# Patient Record
Sex: Female | Born: 1945
Health system: Southern US, Community
[De-identification: ages and names within clinical notes are randomized; demographics above are authoritative.]

## PROBLEM LIST (undated history)

## (undated) DIAGNOSIS — F419 Anxiety disorder, unspecified: Secondary | ICD-10-CM

## (undated) DIAGNOSIS — N189 Chronic kidney disease, unspecified: Secondary | ICD-10-CM

## (undated) DIAGNOSIS — M5412 Radiculopathy, cervical region: Secondary | ICD-10-CM

## (undated) DIAGNOSIS — M1990 Unspecified osteoarthritis, unspecified site: Secondary | ICD-10-CM

## (undated) DIAGNOSIS — E785 Hyperlipidemia, unspecified: Secondary | ICD-10-CM

## (undated) DIAGNOSIS — Z923 Personal history of irradiation: Secondary | ICD-10-CM

## (undated) DIAGNOSIS — D649 Anemia, unspecified: Secondary | ICD-10-CM

## (undated) DIAGNOSIS — E669 Obesity, unspecified: Secondary | ICD-10-CM

## (undated) DIAGNOSIS — H269 Unspecified cataract: Secondary | ICD-10-CM

## (undated) DIAGNOSIS — C50919 Malignant neoplasm of unspecified site of unspecified female breast: Secondary | ICD-10-CM

## (undated) DIAGNOSIS — H9190 Unspecified hearing loss, unspecified ear: Secondary | ICD-10-CM

## (undated) HISTORY — DX: Obesity, unspecified: E66.9

## (undated) HISTORY — DX: Anemia, unspecified: D64.9

## (undated) HISTORY — DX: Unspecified hearing loss, unspecified ear: H91.90

## (undated) HISTORY — PX: FOOT SURGERY: SHX648

## (undated) HISTORY — DX: Chronic kidney disease, unspecified: N18.9

## (undated) HISTORY — PX: BREAST LUMPECTOMY: SHX2

## (undated) HISTORY — DX: Unspecified cataract: H26.9

## (undated) HISTORY — DX: Malignant neoplasm of unspecified site of unspecified female breast: C50.919

## (undated) HISTORY — DX: Radiculopathy, cervical region: M54.12

## (undated) HISTORY — DX: Hyperlipidemia, unspecified: E78.5

## (undated) HISTORY — DX: Unspecified osteoarthritis, unspecified site: M19.90

## (undated) HISTORY — DX: Anxiety disorder, unspecified: F41.9

## (undated) HISTORY — PX: BREAST BIOPSY: SHX20

---

## 1998-01-14 ENCOUNTER — Other Ambulatory Visit: Admission: RE | Admit: 1998-01-14 | Discharge: 1998-01-14 | Payer: Self-pay | Admitting: *Deleted

## 1999-08-11 ENCOUNTER — Other Ambulatory Visit: Admission: RE | Admit: 1999-08-11 | Discharge: 1999-08-11 | Payer: Self-pay | Admitting: *Deleted

## 1999-09-17 ENCOUNTER — Encounter: Admission: RE | Admit: 1999-09-17 | Discharge: 1999-09-17 | Payer: Self-pay | Admitting: *Deleted

## 1999-09-17 ENCOUNTER — Encounter: Payer: Self-pay | Admitting: *Deleted

## 2000-07-28 ENCOUNTER — Other Ambulatory Visit: Admission: RE | Admit: 2000-07-28 | Discharge: 2000-07-28 | Payer: Self-pay | Admitting: *Deleted

## 2000-08-03 ENCOUNTER — Encounter: Admission: RE | Admit: 2000-08-03 | Discharge: 2000-08-03 | Payer: Self-pay | Admitting: *Deleted

## 2000-08-03 ENCOUNTER — Encounter: Payer: Self-pay | Admitting: *Deleted

## 2000-09-02 ENCOUNTER — Other Ambulatory Visit: Admission: RE | Admit: 2000-09-02 | Discharge: 2000-09-02 | Payer: Self-pay | Admitting: Obstetrics and Gynecology

## 2001-09-20 ENCOUNTER — Other Ambulatory Visit: Admission: RE | Admit: 2001-09-20 | Discharge: 2001-09-20 | Payer: Self-pay | Admitting: *Deleted

## 2002-11-24 ENCOUNTER — Encounter: Admission: RE | Admit: 2002-11-24 | Discharge: 2002-11-24 | Payer: Self-pay

## 2003-10-30 ENCOUNTER — Other Ambulatory Visit: Admission: RE | Admit: 2003-10-30 | Discharge: 2003-10-30 | Payer: Self-pay | Admitting: Family Medicine

## 2004-10-15 ENCOUNTER — Emergency Department (HOSPITAL_COMMUNITY): Admission: EM | Admit: 2004-10-15 | Discharge: 2004-10-15 | Payer: Self-pay | Admitting: Emergency Medicine

## 2005-03-19 ENCOUNTER — Other Ambulatory Visit: Admission: RE | Admit: 2005-03-19 | Discharge: 2005-03-19 | Payer: Self-pay | Admitting: Family Medicine

## 2005-05-19 ENCOUNTER — Ambulatory Visit (HOSPITAL_COMMUNITY): Admission: RE | Admit: 2005-05-19 | Discharge: 2005-05-19 | Payer: Self-pay | Admitting: Family Medicine

## 2006-06-08 HISTORY — PX: MENISCUS REPAIR: SHX5179

## 2011-07-20 DIAGNOSIS — H9319 Tinnitus, unspecified ear: Secondary | ICD-10-CM | POA: Diagnosis not present

## 2011-07-20 DIAGNOSIS — J342 Deviated nasal septum: Secondary | ICD-10-CM | POA: Diagnosis not present

## 2011-07-20 DIAGNOSIS — J3089 Other allergic rhinitis: Secondary | ICD-10-CM | POA: Diagnosis not present

## 2011-08-17 DIAGNOSIS — J019 Acute sinusitis, unspecified: Secondary | ICD-10-CM | POA: Diagnosis not present

## 2011-10-01 DIAGNOSIS — H04129 Dry eye syndrome of unspecified lacrimal gland: Secondary | ICD-10-CM | POA: Diagnosis not present

## 2011-10-01 DIAGNOSIS — E119 Type 2 diabetes mellitus without complications: Secondary | ICD-10-CM | POA: Diagnosis not present

## 2011-10-01 DIAGNOSIS — H01009 Unspecified blepharitis unspecified eye, unspecified eyelid: Secondary | ICD-10-CM | POA: Diagnosis not present

## 2011-12-23 DIAGNOSIS — R7309 Other abnormal glucose: Secondary | ICD-10-CM | POA: Diagnosis not present

## 2011-12-23 DIAGNOSIS — E7800 Pure hypercholesterolemia, unspecified: Secondary | ICD-10-CM | POA: Diagnosis not present

## 2011-12-23 DIAGNOSIS — Z0000 Encounter for general adult medical examination without abnormal findings: Secondary | ICD-10-CM | POA: Diagnosis not present

## 2011-12-24 DIAGNOSIS — E7800 Pure hypercholesterolemia, unspecified: Secondary | ICD-10-CM | POA: Diagnosis not present

## 2011-12-24 DIAGNOSIS — R7309 Other abnormal glucose: Secondary | ICD-10-CM | POA: Diagnosis not present

## 2011-12-28 DIAGNOSIS — Z1231 Encounter for screening mammogram for malignant neoplasm of breast: Secondary | ICD-10-CM | POA: Diagnosis not present

## 2012-03-01 DIAGNOSIS — Z23 Encounter for immunization: Secondary | ICD-10-CM | POA: Diagnosis not present

## 2012-05-16 DIAGNOSIS — R109 Unspecified abdominal pain: Secondary | ICD-10-CM | POA: Diagnosis not present

## 2012-05-16 DIAGNOSIS — R05 Cough: Secondary | ICD-10-CM | POA: Diagnosis not present

## 2012-05-16 DIAGNOSIS — R059 Cough, unspecified: Secondary | ICD-10-CM | POA: Diagnosis not present

## 2012-06-12 DIAGNOSIS — J069 Acute upper respiratory infection, unspecified: Secondary | ICD-10-CM | POA: Diagnosis not present

## 2012-06-12 DIAGNOSIS — R0982 Postnasal drip: Secondary | ICD-10-CM | POA: Diagnosis not present

## 2012-06-12 DIAGNOSIS — R05 Cough: Secondary | ICD-10-CM | POA: Diagnosis not present

## 2012-06-12 DIAGNOSIS — J3489 Other specified disorders of nose and nasal sinuses: Secondary | ICD-10-CM | POA: Diagnosis not present

## 2012-06-12 DIAGNOSIS — R059 Cough, unspecified: Secondary | ICD-10-CM | POA: Diagnosis not present

## 2012-08-29 DIAGNOSIS — Z961 Presence of intraocular lens: Secondary | ICD-10-CM | POA: Diagnosis not present

## 2012-08-29 DIAGNOSIS — E119 Type 2 diabetes mellitus without complications: Secondary | ICD-10-CM | POA: Diagnosis not present

## 2012-08-29 DIAGNOSIS — H01009 Unspecified blepharitis unspecified eye, unspecified eyelid: Secondary | ICD-10-CM | POA: Diagnosis not present

## 2012-08-29 DIAGNOSIS — H04129 Dry eye syndrome of unspecified lacrimal gland: Secondary | ICD-10-CM | POA: Diagnosis not present

## 2012-08-30 DIAGNOSIS — R7309 Other abnormal glucose: Secondary | ICD-10-CM | POA: Diagnosis not present

## 2013-03-18 DIAGNOSIS — Z23 Encounter for immunization: Secondary | ICD-10-CM | POA: Diagnosis not present

## 2013-04-17 DIAGNOSIS — R7309 Other abnormal glucose: Secondary | ICD-10-CM | POA: Diagnosis not present

## 2013-04-17 DIAGNOSIS — Z0000 Encounter for general adult medical examination without abnormal findings: Secondary | ICD-10-CM | POA: Diagnosis not present

## 2013-04-17 DIAGNOSIS — E7800 Pure hypercholesterolemia, unspecified: Secondary | ICD-10-CM | POA: Diagnosis not present

## 2013-04-17 DIAGNOSIS — R635 Abnormal weight gain: Secondary | ICD-10-CM | POA: Diagnosis not present

## 2013-04-24 DIAGNOSIS — Z1231 Encounter for screening mammogram for malignant neoplasm of breast: Secondary | ICD-10-CM | POA: Diagnosis not present

## 2013-04-24 DIAGNOSIS — R7309 Other abnormal glucose: Secondary | ICD-10-CM | POA: Diagnosis not present

## 2013-08-02 DIAGNOSIS — Z961 Presence of intraocular lens: Secondary | ICD-10-CM | POA: Diagnosis not present

## 2013-08-02 DIAGNOSIS — H04129 Dry eye syndrome of unspecified lacrimal gland: Secondary | ICD-10-CM | POA: Diagnosis not present

## 2013-08-02 DIAGNOSIS — H01009 Unspecified blepharitis unspecified eye, unspecified eyelid: Secondary | ICD-10-CM | POA: Diagnosis not present

## 2013-08-02 DIAGNOSIS — E119 Type 2 diabetes mellitus without complications: Secondary | ICD-10-CM | POA: Diagnosis not present

## 2013-09-14 DIAGNOSIS — L570 Actinic keratosis: Secondary | ICD-10-CM | POA: Diagnosis not present

## 2013-09-14 DIAGNOSIS — L821 Other seborrheic keratosis: Secondary | ICD-10-CM | POA: Diagnosis not present

## 2013-11-10 DIAGNOSIS — R82998 Other abnormal findings in urine: Secondary | ICD-10-CM | POA: Diagnosis not present

## 2013-11-10 DIAGNOSIS — E785 Hyperlipidemia, unspecified: Secondary | ICD-10-CM | POA: Diagnosis not present

## 2013-11-10 DIAGNOSIS — Z0000 Encounter for general adult medical examination without abnormal findings: Secondary | ICD-10-CM | POA: Diagnosis not present

## 2013-11-10 DIAGNOSIS — R809 Proteinuria, unspecified: Secondary | ICD-10-CM | POA: Diagnosis not present

## 2013-11-21 DIAGNOSIS — Z23 Encounter for immunization: Secondary | ICD-10-CM | POA: Diagnosis not present

## 2013-11-21 DIAGNOSIS — Z1331 Encounter for screening for depression: Secondary | ICD-10-CM | POA: Diagnosis not present

## 2013-11-21 DIAGNOSIS — E785 Hyperlipidemia, unspecified: Secondary | ICD-10-CM | POA: Diagnosis not present

## 2013-11-21 DIAGNOSIS — R7309 Other abnormal glucose: Secondary | ICD-10-CM | POA: Diagnosis not present

## 2013-11-21 DIAGNOSIS — R82998 Other abnormal findings in urine: Secondary | ICD-10-CM | POA: Diagnosis not present

## 2013-11-21 DIAGNOSIS — Z1212 Encounter for screening for malignant neoplasm of rectum: Secondary | ICD-10-CM | POA: Diagnosis not present

## 2013-11-21 DIAGNOSIS — Z0000 Encounter for general adult medical examination without abnormal findings: Secondary | ICD-10-CM | POA: Diagnosis not present

## 2013-11-21 DIAGNOSIS — F411 Generalized anxiety disorder: Secondary | ICD-10-CM | POA: Diagnosis not present

## 2013-11-21 DIAGNOSIS — N951 Menopausal and female climacteric states: Secondary | ICD-10-CM | POA: Diagnosis not present

## 2013-11-21 DIAGNOSIS — F419 Anxiety disorder, unspecified: Secondary | ICD-10-CM | POA: Insufficient documentation

## 2014-01-30 DIAGNOSIS — H00019 Hordeolum externum unspecified eye, unspecified eyelid: Secondary | ICD-10-CM | POA: Diagnosis not present

## 2014-01-30 DIAGNOSIS — M5430 Sciatica, unspecified side: Secondary | ICD-10-CM | POA: Diagnosis not present

## 2014-01-30 DIAGNOSIS — Z6830 Body mass index (BMI) 30.0-30.9, adult: Secondary | ICD-10-CM | POA: Diagnosis not present

## 2014-01-30 DIAGNOSIS — R7309 Other abnormal glucose: Secondary | ICD-10-CM | POA: Diagnosis not present

## 2014-01-30 DIAGNOSIS — M545 Low back pain, unspecified: Secondary | ICD-10-CM | POA: Diagnosis not present

## 2014-02-15 DIAGNOSIS — Z6830 Body mass index (BMI) 30.0-30.9, adult: Secondary | ICD-10-CM | POA: Diagnosis not present

## 2014-02-15 DIAGNOSIS — M545 Low back pain, unspecified: Secondary | ICD-10-CM | POA: Diagnosis not present

## 2014-06-08 HISTORY — PX: COLONOSCOPY: SHX174

## 2014-06-14 DIAGNOSIS — H9190 Unspecified hearing loss, unspecified ear: Secondary | ICD-10-CM | POA: Insufficient documentation

## 2014-06-22 DIAGNOSIS — H903 Sensorineural hearing loss, bilateral: Secondary | ICD-10-CM | POA: Diagnosis not present

## 2014-07-18 DIAGNOSIS — H16223 Keratoconjunctivitis sicca, not specified as Sjogren's, bilateral: Secondary | ICD-10-CM | POA: Diagnosis not present

## 2014-07-18 DIAGNOSIS — Z961 Presence of intraocular lens: Secondary | ICD-10-CM | POA: Diagnosis not present

## 2014-07-18 DIAGNOSIS — R7309 Other abnormal glucose: Secondary | ICD-10-CM | POA: Diagnosis not present

## 2014-07-18 DIAGNOSIS — H527 Unspecified disorder of refraction: Secondary | ICD-10-CM | POA: Diagnosis not present

## 2014-11-19 DIAGNOSIS — R739 Hyperglycemia, unspecified: Secondary | ICD-10-CM | POA: Diagnosis not present

## 2014-11-19 DIAGNOSIS — E785 Hyperlipidemia, unspecified: Secondary | ICD-10-CM | POA: Diagnosis not present

## 2014-11-19 DIAGNOSIS — R8290 Unspecified abnormal findings in urine: Secondary | ICD-10-CM | POA: Diagnosis not present

## 2014-11-26 ENCOUNTER — Other Ambulatory Visit: Payer: Self-pay | Admitting: Internal Medicine

## 2014-11-26 DIAGNOSIS — Z23 Encounter for immunization: Secondary | ICD-10-CM | POA: Diagnosis not present

## 2014-11-26 DIAGNOSIS — M5412 Radiculopathy, cervical region: Secondary | ICD-10-CM | POA: Diagnosis not present

## 2014-11-26 DIAGNOSIS — G4700 Insomnia, unspecified: Secondary | ICD-10-CM | POA: Diagnosis not present

## 2014-11-26 DIAGNOSIS — Z0000 Encounter for general adult medical examination without abnormal findings: Secondary | ICD-10-CM | POA: Diagnosis not present

## 2014-11-26 DIAGNOSIS — M19049 Primary osteoarthritis, unspecified hand: Secondary | ICD-10-CM | POA: Diagnosis not present

## 2014-11-26 DIAGNOSIS — Z6831 Body mass index (BMI) 31.0-31.9, adult: Secondary | ICD-10-CM | POA: Diagnosis not present

## 2014-11-26 DIAGNOSIS — F419 Anxiety disorder, unspecified: Secondary | ICD-10-CM | POA: Diagnosis not present

## 2014-11-26 DIAGNOSIS — N390 Urinary tract infection, site not specified: Secondary | ICD-10-CM | POA: Diagnosis not present

## 2014-11-26 DIAGNOSIS — E785 Hyperlipidemia, unspecified: Secondary | ICD-10-CM | POA: Diagnosis not present

## 2014-11-26 DIAGNOSIS — N183 Chronic kidney disease, stage 3 (moderate): Secondary | ICD-10-CM | POA: Diagnosis not present

## 2014-11-26 DIAGNOSIS — Z1212 Encounter for screening for malignant neoplasm of rectum: Secondary | ICD-10-CM | POA: Diagnosis not present

## 2014-11-26 DIAGNOSIS — Z1389 Encounter for screening for other disorder: Secondary | ICD-10-CM | POA: Diagnosis not present

## 2014-11-26 DIAGNOSIS — R7309 Other abnormal glucose: Secondary | ICD-10-CM | POA: Diagnosis not present

## 2014-11-26 DIAGNOSIS — Z1231 Encounter for screening mammogram for malignant neoplasm of breast: Secondary | ICD-10-CM

## 2014-11-29 ENCOUNTER — Ambulatory Visit
Admission: RE | Admit: 2014-11-29 | Discharge: 2014-11-29 | Disposition: A | Discharge status: Home / Self Care | Payer: Medicare Other | Source: Ambulatory Visit | Attending: Internal Medicine | Admitting: Internal Medicine

## 2014-11-29 DIAGNOSIS — Z1231 Encounter for screening mammogram for malignant neoplasm of breast: Secondary | ICD-10-CM | POA: Diagnosis not present

## 2014-12-07 ENCOUNTER — Encounter: Payer: Self-pay | Admitting: Gastroenterology

## 2014-12-17 DIAGNOSIS — Z01419 Encounter for gynecological examination (general) (routine) without abnormal findings: Secondary | ICD-10-CM | POA: Diagnosis not present

## 2014-12-17 DIAGNOSIS — N952 Postmenopausal atrophic vaginitis: Secondary | ICD-10-CM | POA: Diagnosis not present

## 2014-12-17 DIAGNOSIS — Z6831 Body mass index (BMI) 31.0-31.9, adult: Secondary | ICD-10-CM | POA: Diagnosis not present

## 2015-01-10 ENCOUNTER — Other Ambulatory Visit: Payer: Self-pay

## 2015-01-14 ENCOUNTER — Encounter: Payer: Self-pay | Admitting: Gastroenterology

## 2015-01-14 ENCOUNTER — Ambulatory Visit (INDEPENDENT_AMBULATORY_CARE_PROVIDER_SITE_OTHER): Payer: Medicare Other | Admitting: Gastroenterology

## 2015-01-14 VITALS — BP 122/80 | Ht 68.0 in | Wt 199.0 lb

## 2015-01-14 DIAGNOSIS — R195 Other fecal abnormalities: Secondary | ICD-10-CM | POA: Diagnosis not present

## 2015-01-14 MED ORDER — SOD PHOS MONO-SOD PHOS DIBASIC 1.102-0.398 G PO TABS: 1.0000 | ORAL_TABLET | ORAL | Status: DC

## 2015-01-14 NOTE — Progress Notes (Signed)
HPI: This is a  very pleasant 69 year old woman     who was referred to me by Velna Hatchet, MD  to evaluate  Hemoccult-positive stool .    Chief complaint is Hemoccult-positive stool  Had colonoscopy in [place] 5-6 years ago.   Don't have those results at time of this visit.  As part of routine physical she had FOBT testing and it was positive.  She has alternating constipation (2 days without BM, then loose stools).  This has been a chronic problem for her for many years. She has never tried fiber supplements.  She never sees blood in her stool She had heme occult testing last year also and says it was negative.     Review of systems: Pertinent positive and negative review of systems were noted in the above HPI section. Complete review of systems was performed and was otherwise normal.   Past Medical History  Diagnosis Date  . Anxiety   . HLD (hyperlipidemia)   . Obesity   . Hearing loss   . CKD (chronic kidney disease)     stage 3  . Cervical radiculopathy   . Osteoarthritis     Past Surgical History  Procedure Laterality Date  . Breast biopsy Left   . Meniscus repair Right 2008    Current Outpatient Prescriptions  Medication Sig Dispense Refill  . ALPRAZolam (XANAX) 1 MG tablet Take 1 tablet (1 mg total) by mouth 3 (three) times daily as needed for anxiety. 30 tablet 0  . simvastatin (ZOCOR) 40 MG tablet Take 1 tablet (40 mg total) by mouth at bedtime. 30 tablet   . trimethoprim-polymyxin b (POLYTRIM) ophthalmic solution Place 1 drop into the right eye every 4 (four) hours. 10 mL 0  . venlafaxine (EFFEXOR) 75 MG tablet Take 1 tablet (75 mg total) by mouth 2 (two) times daily with a meal.     No current facility-administered medications for this visit.    Allergies as of 01/14/2015  . (Not on File)    Family History  Problem Relation Age of Onset  . Heart disease Father     age 48 death  . Diabetes Mother   . Alcohol abuse Father   . Dementia Mother      History   Social History  . Marital Status: Married    Spouse Name: N/A  . Number of Children: N/A  . Years of Education: N/A   Occupational History  . Retired    Social History Main Topics  . Smoking status: Never Smoker   . Smokeless tobacco: Not on file  . Alcohol Use: 0.0 oz/week    0 Standard drinks or equivalent per week     Comment: social  . Drug Use: No  . Sexual Activity: Not on file   Other Topics Concern  . Not on file   Social History Narrative     Physical Exam: BP 122/80 mmHg  Ht 5\' 8"  (1.727 m)  Wt 199 lb (90.266 kg)  BMI 30.26 kg/m2 Constitutional: generally well-appearing Psychiatric: alert and oriented x3 Eyes: extraocular movements intact Mouth: oral pharynx moist, no lesions Neck: supple no lymphadenopathy Cardiovascular: heart regular rate and rhythm Lungs: clear to auscultation bilaterally Abdomen: soft, nontender, nondistended, no obvious ascites, no peritoneal signs, normal bowel sounds Extremities: no lower extremity edema bilaterally Skin: no lesions on visible extremities   Assessment and plan: 69 y.o. female with  Hemoccult-positive stool  As part of a routine physical she had stool testing done and  was found to be Hemoccult-positive. Since she had colonoscopy 5 or 6 years ago in [place] and it was normal, she was told she did not need colonoscopy again for 10 years. I think it is unlikely she has anything serious going on her: And since she was found to be Hemoccult-positive should proceed with repeat colonoscopy around now. I did explain to her that if this test shows no polyps or cancers that she does not need colon cancer screening of any type, including annual heme occult cards, for 10 years.   Owens Loffler, MD [facility] 01/14/2015, 10:08 AM  Cc: Velna Hatchet, MD

## 2015-01-14 NOTE — Patient Instructions (Addendum)
Start fibercon pill, one pill once daily for your alternating bowels. You will be set up for a colonoscopy for your hemocult positive stool.

## 2015-01-23 ENCOUNTER — Ambulatory Visit (AMBULATORY_SURGERY_CENTER): Payer: Medicare Other | Admitting: Gastroenterology

## 2015-01-23 ENCOUNTER — Encounter: Payer: Self-pay | Admitting: Gastroenterology

## 2015-01-23 VITALS — BP 131/71 | HR 71 | Temp 98.3°F | Resp 18 | Ht 68.0 in | Wt 199.0 lb

## 2015-01-23 DIAGNOSIS — R195 Other fecal abnormalities: Secondary | ICD-10-CM | POA: Diagnosis not present

## 2015-01-23 DIAGNOSIS — D123 Benign neoplasm of transverse colon: Secondary | ICD-10-CM | POA: Diagnosis not present

## 2015-01-23 DIAGNOSIS — D122 Benign neoplasm of ascending colon: Secondary | ICD-10-CM

## 2015-01-23 MED ORDER — SODIUM CHLORIDE 0.9 % IV SOLN: 500.0000 mL | INTRAVENOUS | Status: DC

## 2015-01-23 NOTE — Patient Instructions (Signed)
YOU HAD AN ENDOSCOPIC PROCEDURE TODAY AT [facility]:   Refer to the procedure report that was given to you for any specific questions about what was found during the examination.  If the procedure report does not answer your questions, please call your gastroenterologist to clarify.  If you requested that your care partner not be given the details of your procedure findings, then the procedure report has been included in a sealed envelope for you to review at your convenience later.  YOU SHOULD EXPECT: Some feelings of bloating in the abdomen. Passage of more gas than usual.  Walking can help get rid of the air that was put into your GI tract during the procedure and reduce the bloating. If you had a lower endoscopy (such as a colonoscopy or flexible sigmoidoscopy) you may notice spotting of blood in your stool or on the toilet paper. If you underwent a bowel prep for your procedure, you may not have a normal bowel movement for a few days.  Please Note:  You might notice some irritation and congestion in your nose or some drainage.  This is from the oxygen used during your procedure.  There is no need for concern and it should clear up in a day or so.  SYMPTOMS TO REPORT IMMEDIATELY:   Following lower endoscopy (colonoscopy or flexible sigmoidoscopy):  Excessive amounts of blood in the stool  Significant tenderness or worsening of abdominal pains  Swelling of the abdomen that is new, acute  Fever of 100F or higher  For urgent or emergent issues, a gastroenterologist can be reached at any hour by calling [phone].   DIET: Your first meal following the procedure should be a small meal and then it is ok to progress to your normal diet. Heavy or fried foods are harder to digest and may make you feel nauseous or bloated.  Likewise, meals heavy in dairy and vegetables can increase bloating.  Drink plenty of fluids but you should avoid alcoholic beverages for 24  hours.  ACTIVITY:  You should plan to take it easy for the rest of today and you should NOT DRIVE or use heavy machinery until tomorrow (because of the sedation medicines used during the test).    FOLLOW UP: Our staff will call the number listed on your records the next business day following your procedure to check on you and address any questions or concerns that you may have regarding the information given to you following your procedure. If we do not reach you, we will leave a message.  However, if you are feeling well and you are not experiencing any problems, there is no need to return our call.  We will assume that you have returned to your regular daily activities without incident.  If any biopsies were taken you will be contacted by phone or by letter within the next 1-3 weeks.  Please call us at [phone] if you have not heard about the biopsies in 3 weeks.    SIGNATURES/CONFIDENTIALITY: You and/or your care partner have signed paperwork which will be entered into your electronic medical record.  These signatures attest to the fact that that the information above on your After Visit Summary has been reviewed and is understood.  Full responsibility of the confidentiality of this discharge information lies with you and/or your care-partner.  Please review diverticulosis and polyp handouts provided.

## 2015-01-23 NOTE — Progress Notes (Signed)
Transferred to recovery room. A/O x3, pleased with MAC.  VSS.  Report to Wendy, RN. 

## 2015-01-23 NOTE — Progress Notes (Signed)
Called to room to assist during endoscopic procedure.  Patient ID and intended procedure confirmed with present staff. Received instructions for my participation in the procedure from the performing physician.  

## 2015-01-23 NOTE — Op Note (Addendum)
[place]  [address] [place], [id number]   COLONOSCOPY PROCEDURE REPORT  PATIENT: Chelsea Lee, Chelsea Lee  MR#: [id number] BIRTHDATE: 11/07/1945 , 69  yrs. old GENDER: female ENDOSCOPIST: Milus Banister, MD REFERRED TI:[id number] Ardeth Perfect, M.D. PROCEDURE DATE:  01/23/2015 PROCEDURE:   Colonoscopy, diagnostic First Screening Colonoscopy - Avg.  risk and is 50 yrs.  old or older - No.  Prior Negative Screening - Now for repeat screening. N/A  History of Adenoma - Now for follow-up colonoscopy & has been > or = to 3 yrs.  N/A  Recommend repeat exam, <10 yrs? No ASA CLASS:   Class II INDICATIONS:FOBT postive stool and Evaluation of unexplained GI bleeding (FOBT + stool, colonoscopy 5-6 years ago was normal). MEDICATIONS: Monitored anesthesia care and Propofol 200 mg IV  DESCRIPTION OF PROCEDURE:   After the risks benefits and alternatives of the procedure were thoroughly explained, informed consent was obtained.  The digital rectal exam revealed no abnormalities of the rectum.   The LB [id number]  endoscope was introduced through the anus and advanced to the cecum, which was identified by both the appendix and ileocecal valve. No adverse events experienced.   The quality of the prep was excellent.  The instrument was then slowly withdrawn as the colon was fully examined. Estimated blood loss is zero unless otherwise noted in this procedure report.  COLON FINDINGS: There was mild diverticulosis noted in the left colon.   The examination was otherwise normal.  Retroflexed views revealed no abnormalities. One 1mm sessile polyp was found, removed and sent to path. This was in the transverse colon. Removed with cold snare.  The time to cecum = 5.3 Withdrawal time = 9.3   The scope was withdrawn and the procedure completed. COMPLICATIONS: There were no immediate complications.  ENDOSCOPIC IMPRESSION: 1.   Mild diverticulosis was noted in the left colon 2.   One  small polyp was found, removed and sent to pathology.  RECOMMENDATIONS: Await final pathology results for final recommendations. You do not need annual hemocult colon cancer screening prior to that.  eSigned:  Milus Banister, MD 01/23/2015 11:42 AM Revised: 01/23/2015 11:42 AM

## 2015-01-24 ENCOUNTER — Ambulatory Visit (INDEPENDENT_AMBULATORY_CARE_PROVIDER_SITE_OTHER): Payer: Medicare Other | Admitting: Physician Assistant

## 2015-01-24 ENCOUNTER — Telehealth: Payer: Self-pay | Admitting: Emergency Medicine

## 2015-01-24 VITALS — BP 142/76 | HR 84 | Temp 98.4°F | Resp 18 | Ht 68.0 in | Wt 200.0 lb

## 2015-01-24 DIAGNOSIS — R21 Rash and other nonspecific skin eruption: Secondary | ICD-10-CM | POA: Diagnosis not present

## 2015-01-24 DIAGNOSIS — S91312A Laceration without foreign body, left foot, initial encounter: Secondary | ICD-10-CM | POA: Diagnosis not present

## 2015-01-24 DIAGNOSIS — M79672 Pain in left foot: Secondary | ICD-10-CM | POA: Diagnosis not present

## 2015-01-24 MED ORDER — CETIRIZINE HCL 10 MG PO TABS: 10.0000 mg | ORAL_TABLET | Freq: Every day | ORAL | Status: DC

## 2015-01-24 NOTE — Telephone Encounter (Signed)
  Follow up Call-  Call back number 01/23/2015  Post procedure Call Back phone  # [phone]  Permission to leave phone message Yes     Patient questions:  Do you have a fever, pain , or abdominal swelling? No. Pain Score  0 *  Have you tolerated food without any problems? Yes.    Have you been able to return to your normal activities? Yes.    Do you have any questions about your discharge instructions: Diet   No. Medications  No. Follow up visit  No.  Do you have questions or concerns about your Care? No.  Actions: * If pain score is 4 or above: No action needed, pain <4.

## 2015-01-24 NOTE — Patient Instructions (Signed)

## 2015-01-24 NOTE — Progress Notes (Signed)
   Subjective:    Patient ID: Chelsea Lee, female    DOB: 1945-07-12, 69 y.o.   MRN: [id number]  HPI Patient presents for rash and splinter in left foot.  Rash has been present mid back for 1-2 weeks and itches, but is not painful. Not sure if it has spread. Has changed laundry detergent 2 weeks ago. Has not been outside. Denies food or environmental allergies.  Has had foot pain for past 2 days. Walks barefoot and not sure if it is from a glass that she broke or from wood floors. Pain does not radiate. Denies numbness, tingling, or weakness. Soaked foot today.  NKDA.   Review of Systems  Constitutional: Negative for fever and chills.  Respiratory: Negative for shortness of breath and wheezing.   Musculoskeletal: Negative for gait problem.  Skin: Positive for rash and wound. Negative for color change.  Neurological: Negative for weakness and numbness.       Objective:   Physical Exam  Constitutional: She is oriented to person, place, and time. She appears well-developed and well-nourished. No distress.  Blood pressure 142/76, pulse 84, temperature 98.4 F (36.9 C), resp. rate 18, height 5\' 8"  (1.727 m), weight 200 lb (90.719 kg), SpO2 96 %.   HENT:  Head: Normocephalic and atraumatic.  Right Ear: External ear normal.  Left Ear: External ear normal.  Eyes: Conjunctivae are normal. Right eye exhibits no discharge. Left eye exhibits no discharge. No scleral icterus.  Pulmonary/Chest: Effort normal.  Neurological: She is alert and oriented to person, place, and time.  Skin: Skin is warm and dry. Rash (stretches from mid to lower back.) noted. She is not diaphoretic.  Psychiatric: She has a normal mood and affect. Her behavior is normal. Judgment and thought content normal.   Procedure Consent obtained. 1/2 cc 1% local anesthesia. Cleaned and explored wound. No wood or glass seen. Wound irrigated. Clean bandaged placed. Care instructions discussed.     Assessment & Plan:    1. Rash and nonspecific skin eruption Can put hydrocortisone cream on back. - cetirizine (ZYRTEC) 10 MG tablet; Take 1 tablet (10 mg total) by mouth daily.  Dispense: 30 tablet; Refill: 11  2. Laceration of foot, left, initial encounter 3. Left foot pain Ibuprofen for pain. Keep area clean and dry. May soak foot.    Alveta Heimlich PA-C  [facility] 01/24/2015 7:32 PM

## 2015-01-28 ENCOUNTER — Encounter: Payer: Self-pay | Admitting: Gastroenterology

## 2015-02-27 DIAGNOSIS — R7309 Other abnormal glucose: Secondary | ICD-10-CM | POA: Diagnosis not present

## 2015-02-27 DIAGNOSIS — Z6831 Body mass index (BMI) 31.0-31.9, adult: Secondary | ICD-10-CM | POA: Diagnosis not present

## 2015-02-27 DIAGNOSIS — K5730 Diverticulosis of large intestine without perforation or abscess without bleeding: Secondary | ICD-10-CM | POA: Diagnosis not present

## 2015-02-27 DIAGNOSIS — Z23 Encounter for immunization: Secondary | ICD-10-CM | POA: Diagnosis not present

## 2015-02-27 DIAGNOSIS — E669 Obesity, unspecified: Secondary | ICD-10-CM | POA: Diagnosis not present

## 2015-02-27 DIAGNOSIS — Z86010 Personal history of colonic polyps: Secondary | ICD-10-CM | POA: Diagnosis not present

## 2015-02-27 DIAGNOSIS — N183 Chronic kidney disease, stage 3 (moderate): Secondary | ICD-10-CM | POA: Diagnosis not present

## 2015-04-18 DIAGNOSIS — M1711 Unilateral primary osteoarthritis, right knee: Secondary | ICD-10-CM | POA: Diagnosis not present

## 2015-04-18 DIAGNOSIS — M1712 Unilateral primary osteoarthritis, left knee: Secondary | ICD-10-CM | POA: Diagnosis not present

## 2015-09-12 DIAGNOSIS — J018 Other acute sinusitis: Secondary | ICD-10-CM | POA: Diagnosis not present

## 2015-11-20 DIAGNOSIS — E784 Other hyperlipidemia: Secondary | ICD-10-CM | POA: Diagnosis not present

## 2015-11-20 DIAGNOSIS — R7309 Other abnormal glucose: Secondary | ICD-10-CM | POA: Diagnosis not present

## 2015-11-27 DIAGNOSIS — Z86010 Personal history of colonic polyps: Secondary | ICD-10-CM | POA: Diagnosis not present

## 2015-11-27 DIAGNOSIS — E668 Other obesity: Secondary | ICD-10-CM | POA: Diagnosis not present

## 2015-11-27 DIAGNOSIS — N183 Chronic kidney disease, stage 3 (moderate): Secondary | ICD-10-CM | POA: Diagnosis not present

## 2015-11-27 DIAGNOSIS — N390 Urinary tract infection, site not specified: Secondary | ICD-10-CM | POA: Diagnosis not present

## 2015-11-27 DIAGNOSIS — R7309 Other abnormal glucose: Secondary | ICD-10-CM | POA: Diagnosis not present

## 2015-11-27 DIAGNOSIS — M25562 Pain in left knee: Secondary | ICD-10-CM | POA: Diagnosis not present

## 2015-11-27 DIAGNOSIS — Z1389 Encounter for screening for other disorder: Secondary | ICD-10-CM | POA: Diagnosis not present

## 2015-11-27 DIAGNOSIS — F419 Anxiety disorder, unspecified: Secondary | ICD-10-CM | POA: Diagnosis not present

## 2015-11-27 DIAGNOSIS — Z6833 Body mass index (BMI) 33.0-33.9, adult: Secondary | ICD-10-CM | POA: Diagnosis not present

## 2015-11-27 DIAGNOSIS — G4700 Insomnia, unspecified: Secondary | ICD-10-CM | POA: Diagnosis not present

## 2015-11-27 DIAGNOSIS — E784 Other hyperlipidemia: Secondary | ICD-10-CM | POA: Diagnosis not present

## 2015-11-27 DIAGNOSIS — R8299 Other abnormal findings in urine: Secondary | ICD-10-CM | POA: Diagnosis not present

## 2015-11-27 DIAGNOSIS — Z0000 Encounter for general adult medical examination without abnormal findings: Secondary | ICD-10-CM | POA: Diagnosis not present

## 2015-11-29 DIAGNOSIS — Z1212 Encounter for screening for malignant neoplasm of rectum: Secondary | ICD-10-CM | POA: Diagnosis not present

## 2016-02-13 DIAGNOSIS — M1711 Unilateral primary osteoarthritis, right knee: Secondary | ICD-10-CM | POA: Diagnosis not present

## 2016-02-13 DIAGNOSIS — M170 Bilateral primary osteoarthritis of knee: Secondary | ICD-10-CM | POA: Diagnosis not present

## 2016-02-13 DIAGNOSIS — M25461 Effusion, right knee: Secondary | ICD-10-CM | POA: Diagnosis not present

## 2016-02-13 DIAGNOSIS — M2241 Chondromalacia patellae, right knee: Secondary | ICD-10-CM | POA: Diagnosis not present

## 2016-02-13 DIAGNOSIS — M25561 Pain in right knee: Secondary | ICD-10-CM | POA: Diagnosis not present

## 2016-03-09 DIAGNOSIS — Z23 Encounter for immunization: Secondary | ICD-10-CM | POA: Diagnosis not present

## 2016-04-17 DIAGNOSIS — K5730 Diverticulosis of large intestine without perforation or abscess without bleeding: Secondary | ICD-10-CM | POA: Diagnosis not present

## 2016-04-17 DIAGNOSIS — R1011 Right upper quadrant pain: Secondary | ICD-10-CM | POA: Diagnosis not present

## 2016-04-17 DIAGNOSIS — R1031 Right lower quadrant pain: Secondary | ICD-10-CM | POA: Diagnosis not present

## 2016-04-17 DIAGNOSIS — I700 Atherosclerosis of aorta: Secondary | ICD-10-CM | POA: Diagnosis not present

## 2016-04-17 DIAGNOSIS — N858 Other specified noninflammatory disorders of uterus: Secondary | ICD-10-CM | POA: Diagnosis not present

## 2016-04-20 DIAGNOSIS — C4491 Basal cell carcinoma of skin, unspecified: Secondary | ICD-10-CM | POA: Diagnosis not present

## 2016-04-20 DIAGNOSIS — D239 Other benign neoplasm of skin, unspecified: Secondary | ICD-10-CM | POA: Diagnosis not present

## 2016-04-20 DIAGNOSIS — L814 Other melanin hyperpigmentation: Secondary | ICD-10-CM | POA: Diagnosis not present

## 2016-04-20 DIAGNOSIS — L821 Other seborrheic keratosis: Secondary | ICD-10-CM | POA: Diagnosis not present

## 2016-04-20 DIAGNOSIS — I781 Nevus, non-neoplastic: Secondary | ICD-10-CM | POA: Diagnosis not present

## 2016-04-20 DIAGNOSIS — D492 Neoplasm of unspecified behavior of bone, soft tissue, and skin: Secondary | ICD-10-CM | POA: Diagnosis not present

## 2016-05-14 DIAGNOSIS — C4491 Basal cell carcinoma of skin, unspecified: Secondary | ICD-10-CM | POA: Diagnosis not present

## 2016-05-14 DIAGNOSIS — C44319 Basal cell carcinoma of skin of other parts of face: Secondary | ICD-10-CM | POA: Diagnosis not present

## 2016-06-10 DIAGNOSIS — Z87891 Personal history of nicotine dependence: Secondary | ICD-10-CM | POA: Diagnosis not present

## 2016-06-10 DIAGNOSIS — M7741 Metatarsalgia, right foot: Secondary | ICD-10-CM | POA: Diagnosis not present

## 2016-06-10 DIAGNOSIS — M25561 Pain in right knee: Secondary | ICD-10-CM | POA: Diagnosis not present

## 2016-06-10 DIAGNOSIS — M79671 Pain in right foot: Secondary | ICD-10-CM | POA: Diagnosis not present

## 2016-06-10 DIAGNOSIS — G8929 Other chronic pain: Secondary | ICD-10-CM | POA: Diagnosis not present

## 2016-07-10 DIAGNOSIS — C44319 Basal cell carcinoma of skin of other parts of face: Secondary | ICD-10-CM | POA: Diagnosis not present

## 2016-07-14 DIAGNOSIS — M19071 Primary osteoarthritis, right ankle and foot: Secondary | ICD-10-CM | POA: Diagnosis not present

## 2016-07-14 DIAGNOSIS — M25571 Pain in right ankle and joints of right foot: Secondary | ICD-10-CM | POA: Diagnosis not present

## 2016-07-17 DIAGNOSIS — C44319 Basal cell carcinoma of skin of other parts of face: Secondary | ICD-10-CM | POA: Diagnosis not present

## 2016-07-22 DIAGNOSIS — C4491 Basal cell carcinoma of skin, unspecified: Secondary | ICD-10-CM | POA: Diagnosis not present

## 2016-07-22 DIAGNOSIS — L814 Other melanin hyperpigmentation: Secondary | ICD-10-CM | POA: Diagnosis not present

## 2016-07-22 DIAGNOSIS — L905 Scar conditions and fibrosis of skin: Secondary | ICD-10-CM | POA: Diagnosis not present

## 2016-07-22 DIAGNOSIS — L821 Other seborrheic keratosis: Secondary | ICD-10-CM | POA: Diagnosis not present

## 2016-07-22 DIAGNOSIS — H00019 Hordeolum externum unspecified eye, unspecified eyelid: Secondary | ICD-10-CM | POA: Diagnosis not present

## 2016-07-22 DIAGNOSIS — L720 Epidermal cyst: Secondary | ICD-10-CM | POA: Diagnosis not present

## 2016-07-22 DIAGNOSIS — L570 Actinic keratosis: Secondary | ICD-10-CM | POA: Diagnosis not present

## 2016-07-24 DIAGNOSIS — C44319 Basal cell carcinoma of skin of other parts of face: Secondary | ICD-10-CM | POA: Diagnosis not present

## 2016-07-27 DIAGNOSIS — C44319 Basal cell carcinoma of skin of other parts of face: Secondary | ICD-10-CM | POA: Diagnosis not present

## 2016-07-28 DIAGNOSIS — C44319 Basal cell carcinoma of skin of other parts of face: Secondary | ICD-10-CM | POA: Diagnosis not present

## 2016-07-29 DIAGNOSIS — C44319 Basal cell carcinoma of skin of other parts of face: Secondary | ICD-10-CM | POA: Diagnosis not present

## 2016-07-30 DIAGNOSIS — N950 Postmenopausal bleeding: Secondary | ICD-10-CM | POA: Diagnosis not present

## 2016-07-30 DIAGNOSIS — C44319 Basal cell carcinoma of skin of other parts of face: Secondary | ICD-10-CM | POA: Diagnosis not present

## 2016-07-31 DIAGNOSIS — C44319 Basal cell carcinoma of skin of other parts of face: Secondary | ICD-10-CM | POA: Diagnosis not present

## 2016-08-03 DIAGNOSIS — C44319 Basal cell carcinoma of skin of other parts of face: Secondary | ICD-10-CM | POA: Diagnosis not present

## 2016-08-04 DIAGNOSIS — C44319 Basal cell carcinoma of skin of other parts of face: Secondary | ICD-10-CM | POA: Diagnosis not present

## 2016-08-05 DIAGNOSIS — C44319 Basal cell carcinoma of skin of other parts of face: Secondary | ICD-10-CM | POA: Diagnosis not present

## 2016-08-06 DIAGNOSIS — D259 Leiomyoma of uterus, unspecified: Secondary | ICD-10-CM | POA: Diagnosis not present

## 2016-08-06 DIAGNOSIS — C44319 Basal cell carcinoma of skin of other parts of face: Secondary | ICD-10-CM | POA: Diagnosis not present

## 2016-08-06 DIAGNOSIS — N950 Postmenopausal bleeding: Secondary | ICD-10-CM | POA: Diagnosis not present

## 2016-08-07 DIAGNOSIS — C44319 Basal cell carcinoma of skin of other parts of face: Secondary | ICD-10-CM | POA: Diagnosis not present

## 2016-08-10 DIAGNOSIS — C44319 Basal cell carcinoma of skin of other parts of face: Secondary | ICD-10-CM | POA: Diagnosis not present

## 2016-08-11 DIAGNOSIS — C44319 Basal cell carcinoma of skin of other parts of face: Secondary | ICD-10-CM | POA: Diagnosis not present

## 2016-08-12 DIAGNOSIS — C44319 Basal cell carcinoma of skin of other parts of face: Secondary | ICD-10-CM | POA: Diagnosis not present

## 2016-08-13 DIAGNOSIS — C44319 Basal cell carcinoma of skin of other parts of face: Secondary | ICD-10-CM | POA: Diagnosis not present

## 2016-08-14 DIAGNOSIS — C44319 Basal cell carcinoma of skin of other parts of face: Secondary | ICD-10-CM | POA: Diagnosis not present

## 2016-08-17 DIAGNOSIS — C44319 Basal cell carcinoma of skin of other parts of face: Secondary | ICD-10-CM | POA: Diagnosis not present

## 2016-08-18 DIAGNOSIS — C44319 Basal cell carcinoma of skin of other parts of face: Secondary | ICD-10-CM | POA: Diagnosis not present

## 2016-08-19 DIAGNOSIS — C44319 Basal cell carcinoma of skin of other parts of face: Secondary | ICD-10-CM | POA: Diagnosis not present

## 2016-08-20 DIAGNOSIS — C44319 Basal cell carcinoma of skin of other parts of face: Secondary | ICD-10-CM | POA: Diagnosis not present

## 2016-08-21 DIAGNOSIS — Z01818 Encounter for other preprocedural examination: Secondary | ICD-10-CM | POA: Diagnosis not present

## 2016-08-21 DIAGNOSIS — N950 Postmenopausal bleeding: Secondary | ICD-10-CM | POA: Diagnosis not present

## 2016-08-21 DIAGNOSIS — Z01812 Encounter for preprocedural laboratory examination: Secondary | ICD-10-CM | POA: Diagnosis not present

## 2016-08-21 DIAGNOSIS — C44319 Basal cell carcinoma of skin of other parts of face: Secondary | ICD-10-CM | POA: Diagnosis not present

## 2016-08-24 DIAGNOSIS — Z79899 Other long term (current) drug therapy: Secondary | ICD-10-CM | POA: Diagnosis not present

## 2016-08-24 DIAGNOSIS — D259 Leiomyoma of uterus, unspecified: Secondary | ICD-10-CM | POA: Diagnosis not present

## 2016-08-24 DIAGNOSIS — N939 Abnormal uterine and vaginal bleeding, unspecified: Secondary | ICD-10-CM | POA: Diagnosis not present

## 2016-08-24 DIAGNOSIS — N840 Polyp of corpus uteri: Secondary | ICD-10-CM | POA: Diagnosis not present

## 2016-08-24 DIAGNOSIS — N841 Polyp of cervix uteri: Secondary | ICD-10-CM | POA: Diagnosis not present

## 2016-08-24 DIAGNOSIS — N950 Postmenopausal bleeding: Secondary | ICD-10-CM | POA: Diagnosis not present

## 2016-08-24 DIAGNOSIS — Z85828 Personal history of other malignant neoplasm of skin: Secondary | ICD-10-CM | POA: Diagnosis not present

## 2016-08-24 DIAGNOSIS — Z87891 Personal history of nicotine dependence: Secondary | ICD-10-CM | POA: Diagnosis not present

## 2016-08-24 DIAGNOSIS — E7800 Pure hypercholesterolemia, unspecified: Secondary | ICD-10-CM | POA: Diagnosis not present

## 2016-08-24 DIAGNOSIS — E785 Hyperlipidemia, unspecified: Secondary | ICD-10-CM | POA: Diagnosis not present

## 2016-08-24 DIAGNOSIS — M1990 Unspecified osteoarthritis, unspecified site: Secondary | ICD-10-CM | POA: Diagnosis not present

## 2016-08-24 DIAGNOSIS — F419 Anxiety disorder, unspecified: Secondary | ICD-10-CM | POA: Diagnosis not present

## 2016-08-31 DIAGNOSIS — T8130XA Disruption of wound, unspecified, initial encounter: Secondary | ICD-10-CM | POA: Diagnosis not present

## 2016-09-08 DIAGNOSIS — R300 Dysuria: Secondary | ICD-10-CM | POA: Diagnosis not present

## 2016-09-08 DIAGNOSIS — Z09 Encounter for follow-up examination after completed treatment for conditions other than malignant neoplasm: Secondary | ICD-10-CM | POA: Diagnosis not present

## 2016-09-10 DIAGNOSIS — L570 Actinic keratosis: Secondary | ICD-10-CM | POA: Diagnosis not present

## 2016-09-12 DIAGNOSIS — R509 Fever, unspecified: Secondary | ICD-10-CM | POA: Diagnosis not present

## 2016-09-12 DIAGNOSIS — N390 Urinary tract infection, site not specified: Secondary | ICD-10-CM | POA: Diagnosis not present

## 2016-10-08 DIAGNOSIS — L905 Scar conditions and fibrosis of skin: Secondary | ICD-10-CM | POA: Diagnosis not present

## 2016-10-08 DIAGNOSIS — L814 Other melanin hyperpigmentation: Secondary | ICD-10-CM | POA: Diagnosis not present

## 2016-11-03 DIAGNOSIS — T162XXA Foreign body in left ear, initial encounter: Secondary | ICD-10-CM | POA: Diagnosis not present

## 2016-11-24 DIAGNOSIS — N183 Chronic kidney disease, stage 3 (moderate): Secondary | ICD-10-CM | POA: Diagnosis not present

## 2016-11-24 DIAGNOSIS — R739 Hyperglycemia, unspecified: Secondary | ICD-10-CM | POA: Diagnosis not present

## 2016-11-24 DIAGNOSIS — R7309 Other abnormal glucose: Secondary | ICD-10-CM | POA: Diagnosis not present

## 2016-11-24 DIAGNOSIS — Z0000 Encounter for general adult medical examination without abnormal findings: Secondary | ICD-10-CM | POA: Diagnosis not present

## 2016-11-24 DIAGNOSIS — E784 Other hyperlipidemia: Secondary | ICD-10-CM | POA: Diagnosis not present

## 2016-11-26 DIAGNOSIS — L821 Other seborrheic keratosis: Secondary | ICD-10-CM | POA: Diagnosis not present

## 2016-11-26 DIAGNOSIS — I8312 Varicose veins of left lower extremity with inflammation: Secondary | ICD-10-CM | POA: Diagnosis not present

## 2016-11-26 DIAGNOSIS — Z85828 Personal history of other malignant neoplasm of skin: Secondary | ICD-10-CM | POA: Diagnosis not present

## 2016-11-26 DIAGNOSIS — I8311 Varicose veins of right lower extremity with inflammation: Secondary | ICD-10-CM | POA: Diagnosis not present

## 2016-11-26 DIAGNOSIS — L814 Other melanin hyperpigmentation: Secondary | ICD-10-CM | POA: Diagnosis not present

## 2016-11-26 DIAGNOSIS — I872 Venous insufficiency (chronic) (peripheral): Secondary | ICD-10-CM | POA: Diagnosis not present

## 2016-11-26 DIAGNOSIS — D485 Neoplasm of uncertain behavior of skin: Secondary | ICD-10-CM | POA: Diagnosis not present

## 2016-11-27 DIAGNOSIS — Z8489 Family history of other specified conditions: Secondary | ICD-10-CM | POA: Diagnosis not present

## 2016-11-27 DIAGNOSIS — H9190 Unspecified hearing loss, unspecified ear: Secondary | ICD-10-CM | POA: Diagnosis not present

## 2016-11-27 DIAGNOSIS — Z1212 Encounter for screening for malignant neoplasm of rectum: Secondary | ICD-10-CM | POA: Diagnosis not present

## 2016-12-01 DIAGNOSIS — Z86010 Personal history of colonic polyps: Secondary | ICD-10-CM | POA: Diagnosis not present

## 2016-12-01 DIAGNOSIS — G4700 Insomnia, unspecified: Secondary | ICD-10-CM | POA: Diagnosis not present

## 2016-12-01 DIAGNOSIS — R7309 Other abnormal glucose: Secondary | ICD-10-CM | POA: Diagnosis not present

## 2016-12-01 DIAGNOSIS — Z6830 Body mass index (BMI) 30.0-30.9, adult: Secondary | ICD-10-CM | POA: Diagnosis not present

## 2016-12-01 DIAGNOSIS — F419 Anxiety disorder, unspecified: Secondary | ICD-10-CM | POA: Diagnosis not present

## 2016-12-01 DIAGNOSIS — R809 Proteinuria, unspecified: Secondary | ICD-10-CM | POA: Diagnosis not present

## 2016-12-01 DIAGNOSIS — Z1389 Encounter for screening for other disorder: Secondary | ICD-10-CM | POA: Diagnosis not present

## 2016-12-01 DIAGNOSIS — E784 Other hyperlipidemia: Secondary | ICD-10-CM | POA: Diagnosis not present

## 2016-12-01 DIAGNOSIS — K5900 Constipation, unspecified: Secondary | ICD-10-CM | POA: Diagnosis not present

## 2016-12-01 DIAGNOSIS — E669 Obesity, unspecified: Secondary | ICD-10-CM | POA: Diagnosis not present

## 2016-12-01 DIAGNOSIS — M79673 Pain in unspecified foot: Secondary | ICD-10-CM | POA: Diagnosis not present

## 2016-12-01 DIAGNOSIS — Z0000 Encounter for general adult medical examination without abnormal findings: Secondary | ICD-10-CM | POA: Diagnosis not present

## 2017-03-01 DIAGNOSIS — H16221 Keratoconjunctivitis sicca, not specified as Sjogren's, right eye: Secondary | ICD-10-CM | POA: Diagnosis not present

## 2017-03-01 DIAGNOSIS — H527 Unspecified disorder of refraction: Secondary | ICD-10-CM | POA: Diagnosis not present

## 2017-03-09 DIAGNOSIS — G609 Hereditary and idiopathic neuropathy, unspecified: Secondary | ICD-10-CM | POA: Diagnosis not present

## 2017-03-09 DIAGNOSIS — M19071 Primary osteoarthritis, right ankle and foot: Secondary | ICD-10-CM | POA: Diagnosis not present

## 2017-04-11 DIAGNOSIS — Z23 Encounter for immunization: Secondary | ICD-10-CM | POA: Diagnosis not present

## 2017-08-02 DIAGNOSIS — Z85828 Personal history of other malignant neoplasm of skin: Secondary | ICD-10-CM | POA: Diagnosis not present

## 2017-08-02 DIAGNOSIS — M19071 Primary osteoarthritis, right ankle and foot: Secondary | ICD-10-CM | POA: Diagnosis not present

## 2017-08-02 DIAGNOSIS — L570 Actinic keratosis: Secondary | ICD-10-CM | POA: Diagnosis not present

## 2017-08-02 DIAGNOSIS — Z08 Encounter for follow-up examination after completed treatment for malignant neoplasm: Secondary | ICD-10-CM | POA: Diagnosis not present

## 2017-08-11 DIAGNOSIS — R600 Localized edema: Secondary | ICD-10-CM | POA: Diagnosis not present

## 2017-08-11 DIAGNOSIS — M19071 Primary osteoarthritis, right ankle and foot: Secondary | ICD-10-CM | POA: Diagnosis not present

## 2017-08-31 DIAGNOSIS — Z23 Encounter for immunization: Secondary | ICD-10-CM | POA: Diagnosis not present

## 2017-08-31 DIAGNOSIS — Z0000 Encounter for general adult medical examination without abnormal findings: Secondary | ICD-10-CM | POA: Diagnosis not present

## 2017-09-21 DIAGNOSIS — M19071 Primary osteoarthritis, right ankle and foot: Secondary | ICD-10-CM | POA: Diagnosis not present

## 2017-12-02 DIAGNOSIS — Z1331 Encounter for screening for depression: Secondary | ICD-10-CM | POA: Diagnosis not present

## 2017-12-02 DIAGNOSIS — M19079 Primary osteoarthritis, unspecified ankle and foot: Secondary | ICD-10-CM | POA: Diagnosis not present

## 2017-12-06 DIAGNOSIS — N183 Chronic kidney disease, stage 3 (moderate): Secondary | ICD-10-CM | POA: Diagnosis not present

## 2017-12-06 DIAGNOSIS — R82998 Other abnormal findings in urine: Secondary | ICD-10-CM | POA: Diagnosis not present

## 2017-12-06 DIAGNOSIS — Z13820 Encounter for screening for osteoporosis: Secondary | ICD-10-CM | POA: Diagnosis not present

## 2017-12-06 DIAGNOSIS — E7849 Other hyperlipidemia: Secondary | ICD-10-CM | POA: Diagnosis not present

## 2017-12-06 DIAGNOSIS — R7309 Other abnormal glucose: Secondary | ICD-10-CM | POA: Diagnosis not present

## 2017-12-08 DIAGNOSIS — D1801 Hemangioma of skin and subcutaneous tissue: Secondary | ICD-10-CM | POA: Diagnosis not present

## 2017-12-08 DIAGNOSIS — D225 Melanocytic nevi of trunk: Secondary | ICD-10-CM | POA: Diagnosis not present

## 2017-12-08 DIAGNOSIS — L570 Actinic keratosis: Secondary | ICD-10-CM | POA: Diagnosis not present

## 2017-12-08 DIAGNOSIS — L821 Other seborrheic keratosis: Secondary | ICD-10-CM | POA: Diagnosis not present

## 2017-12-08 DIAGNOSIS — L814 Other melanin hyperpigmentation: Secondary | ICD-10-CM | POA: Diagnosis not present

## 2017-12-08 DIAGNOSIS — Z85828 Personal history of other malignant neoplasm of skin: Secondary | ICD-10-CM | POA: Diagnosis not present

## 2017-12-13 DIAGNOSIS — R635 Abnormal weight gain: Secondary | ICD-10-CM | POA: Diagnosis not present

## 2017-12-13 DIAGNOSIS — E7849 Other hyperlipidemia: Secondary | ICD-10-CM | POA: Diagnosis not present

## 2017-12-13 DIAGNOSIS — E668 Other obesity: Secondary | ICD-10-CM | POA: Diagnosis not present

## 2017-12-13 DIAGNOSIS — Z6830 Body mass index (BMI) 30.0-30.9, adult: Secondary | ICD-10-CM | POA: Diagnosis not present

## 2017-12-13 DIAGNOSIS — N183 Chronic kidney disease, stage 3 (moderate): Secondary | ICD-10-CM | POA: Diagnosis not present

## 2017-12-13 DIAGNOSIS — R808 Other proteinuria: Secondary | ICD-10-CM | POA: Diagnosis not present

## 2017-12-13 DIAGNOSIS — Z1389 Encounter for screening for other disorder: Secondary | ICD-10-CM | POA: Diagnosis not present

## 2017-12-13 DIAGNOSIS — Z86010 Personal history of colonic polyps: Secondary | ICD-10-CM | POA: Diagnosis not present

## 2017-12-13 DIAGNOSIS — Z85828 Personal history of other malignant neoplasm of skin: Secondary | ICD-10-CM | POA: Diagnosis not present

## 2017-12-13 DIAGNOSIS — Z0000 Encounter for general adult medical examination without abnormal findings: Secondary | ICD-10-CM | POA: Diagnosis not present

## 2017-12-13 DIAGNOSIS — R5383 Other fatigue: Secondary | ICD-10-CM | POA: Diagnosis not present

## 2017-12-13 DIAGNOSIS — M79671 Pain in right foot: Secondary | ICD-10-CM | POA: Diagnosis not present

## 2017-12-17 DIAGNOSIS — Z1212 Encounter for screening for malignant neoplasm of rectum: Secondary | ICD-10-CM | POA: Diagnosis not present

## 2017-12-22 DIAGNOSIS — M12571 Traumatic arthropathy, right ankle and foot: Secondary | ICD-10-CM | POA: Diagnosis not present

## 2018-01-11 DIAGNOSIS — M12571 Traumatic arthropathy, right ankle and foot: Secondary | ICD-10-CM | POA: Diagnosis not present

## 2018-03-03 DIAGNOSIS — Z961 Presence of intraocular lens: Secondary | ICD-10-CM | POA: Diagnosis not present

## 2018-03-03 DIAGNOSIS — H01004 Unspecified blepharitis left upper eyelid: Secondary | ICD-10-CM | POA: Diagnosis not present

## 2018-03-03 DIAGNOSIS — M12571 Traumatic arthropathy, right ankle and foot: Secondary | ICD-10-CM | POA: Diagnosis not present

## 2018-03-03 DIAGNOSIS — H16223 Keratoconjunctivitis sicca, not specified as Sjogren's, bilateral: Secondary | ICD-10-CM | POA: Diagnosis not present

## 2018-03-23 DIAGNOSIS — M12571 Traumatic arthropathy, right ankle and foot: Secondary | ICD-10-CM | POA: Diagnosis not present

## 2018-03-31 DIAGNOSIS — Z23 Encounter for immunization: Secondary | ICD-10-CM | POA: Diagnosis not present

## 2018-04-14 DIAGNOSIS — E669 Obesity, unspecified: Secondary | ICD-10-CM | POA: Diagnosis not present

## 2018-04-14 DIAGNOSIS — M1990 Unspecified osteoarthritis, unspecified site: Secondary | ICD-10-CM | POA: Diagnosis not present

## 2018-04-14 DIAGNOSIS — R7303 Prediabetes: Secondary | ICD-10-CM | POA: Diagnosis not present

## 2018-04-14 DIAGNOSIS — Z7689 Persons encountering health services in other specified circumstances: Secondary | ICD-10-CM | POA: Diagnosis not present

## 2018-05-25 DIAGNOSIS — M12571 Traumatic arthropathy, right ankle and foot: Secondary | ICD-10-CM | POA: Diagnosis not present

## 2018-06-09 DIAGNOSIS — Z01818 Encounter for other preprocedural examination: Secondary | ICD-10-CM | POA: Diagnosis not present

## 2018-06-09 DIAGNOSIS — E785 Hyperlipidemia, unspecified: Secondary | ICD-10-CM | POA: Diagnosis not present

## 2018-06-17 DIAGNOSIS — M12571 Traumatic arthropathy, right ankle and foot: Secondary | ICD-10-CM | POA: Diagnosis not present

## 2018-06-28 DIAGNOSIS — J019 Acute sinusitis, unspecified: Secondary | ICD-10-CM | POA: Diagnosis not present

## 2018-06-28 DIAGNOSIS — J209 Acute bronchitis, unspecified: Secondary | ICD-10-CM | POA: Diagnosis not present

## 2018-07-01 DIAGNOSIS — J209 Acute bronchitis, unspecified: Secondary | ICD-10-CM | POA: Diagnosis not present

## 2018-07-06 DIAGNOSIS — J40 Bronchitis, not specified as acute or chronic: Secondary | ICD-10-CM | POA: Diagnosis not present

## 2018-07-06 DIAGNOSIS — J019 Acute sinusitis, unspecified: Secondary | ICD-10-CM | POA: Diagnosis not present

## 2018-07-06 DIAGNOSIS — R0982 Postnasal drip: Secondary | ICD-10-CM | POA: Diagnosis not present

## 2018-07-06 DIAGNOSIS — J209 Acute bronchitis, unspecified: Secondary | ICD-10-CM | POA: Diagnosis not present

## 2018-07-13 DIAGNOSIS — J40 Bronchitis, not specified as acute or chronic: Secondary | ICD-10-CM | POA: Diagnosis not present

## 2018-08-02 DIAGNOSIS — E785 Hyperlipidemia, unspecified: Secondary | ICD-10-CM | POA: Diagnosis not present

## 2018-08-02 DIAGNOSIS — M19071 Primary osteoarthritis, right ankle and foot: Secondary | ICD-10-CM | POA: Diagnosis not present

## 2018-08-02 DIAGNOSIS — E119 Type 2 diabetes mellitus without complications: Secondary | ICD-10-CM | POA: Diagnosis not present

## 2018-08-02 DIAGNOSIS — M12571 Traumatic arthropathy, right ankle and foot: Secondary | ICD-10-CM | POA: Diagnosis not present

## 2018-08-10 DIAGNOSIS — M12571 Traumatic arthropathy, right ankle and foot: Secondary | ICD-10-CM | POA: Diagnosis not present

## 2018-09-14 DIAGNOSIS — M12571 Traumatic arthropathy, right ankle and foot: Secondary | ICD-10-CM | POA: Diagnosis not present

## 2018-10-12 DIAGNOSIS — M12571 Traumatic arthropathy, right ankle and foot: Secondary | ICD-10-CM | POA: Diagnosis not present

## 2018-11-15 ENCOUNTER — Ambulatory Visit (INDEPENDENT_AMBULATORY_CARE_PROVIDER_SITE_OTHER): Payer: Medicare Other | Admitting: Podiatry

## 2018-11-15 ENCOUNTER — Encounter: Payer: Self-pay | Admitting: Podiatry

## 2018-11-15 ENCOUNTER — Other Ambulatory Visit: Payer: Self-pay

## 2018-11-15 ENCOUNTER — Ambulatory Visit (INDEPENDENT_AMBULATORY_CARE_PROVIDER_SITE_OTHER): Payer: Medicare Other

## 2018-11-15 VITALS — BP 137/72 | HR 60 | Temp 97.9°F | Resp 16

## 2018-11-15 DIAGNOSIS — M779 Enthesopathy, unspecified: Secondary | ICD-10-CM

## 2018-11-15 DIAGNOSIS — M778 Other enthesopathies, not elsewhere classified: Secondary | ICD-10-CM

## 2018-11-15 DIAGNOSIS — I890 Lymphedema, not elsewhere classified: Secondary | ICD-10-CM

## 2018-11-15 DIAGNOSIS — G8918 Other acute postprocedural pain: Secondary | ICD-10-CM

## 2018-11-15 MED ORDER — OXYCODONE-ACETAMINOPHEN 10-325 MG PO TABS: 1.0000 | ORAL_TABLET | Freq: Four times a day (QID) | ORAL | 0 refills | Status: AC | PRN

## 2018-11-15 MED ORDER — METHYLPREDNISOLONE 4 MG PO TBPK: ORAL_TABLET | ORAL | 0 refills | Status: DC

## 2018-11-15 NOTE — Progress Notes (Signed)
  Subjective:  Patient ID: Chelsea Lee, female    DOB: March 21, 1946,  MRN: [id number] HPI Chief Complaint  Patient presents with  . Foot Pain    Dorsal midfoot right - surgery in Feb 2020, fused midfoot, still very swollen, red, and painful, needs post op care, just moved from [place]-needs refill on pain meds  . New Patient (Initial Visit)    73 y.o. female presents with the above complaint.   ROS: She denies fever chills nausea vomiting muscle aches pains calf pain back pain chest pain shortness of breath.  She states that her foot is the only thing that bothers her right now.  Past Medical History:  Diagnosis Date  . Anxiety   . Cervical radiculopathy   . CKD (chronic kidney disease)    stage 3  . Hearing loss   . HLD (hyperlipidemia)   . Obesity   . Osteoarthritis    Past Surgical History:  Procedure Laterality Date  . BREAST BIOPSY Left   . MENISCUS REPAIR Right 2008    Current Outpatient Medications:  .  linaCLOtide (LINZESS PO), Take by mouth., Disp: , Rfl:  .  ALPRAZolam (XANAX) 1 MG tablet, Take 1 tablet (1 mg total) by mouth 3 (three) times daily as needed for anxiety., Disp: 30 tablet, Rfl: 0 .  methylPREDNISolone (MEDROL DOSEPAK) 4 MG TBPK tablet, 6 day dose pack - take as directed, Disp: 21 tablet, Rfl: 0 .  oxyCODONE-acetaminophen (PERCOCET) 10-325 MG tablet, Take 1 tablet by mouth every 6 (six) hours as needed for up to 5 days for pain., Disp: 20 tablet, Rfl: 0 .  simvastatin (ZOCOR) 40 MG tablet, Take 1 tablet (40 mg total) by mouth at bedtime., Disp: 30 tablet, Rfl:   No Known Allergies Review of Systems Objective:   Vitals:   11/15/18 1145  BP: 137/72  Pulse: 60  Resp: 16  Temp: 97.9 F (36.6 C)    General: Well developed, nourished, in no acute distress, alert and oriented x3   Dermatological: Skin is warm, dry and supple bilateral. Nails x 10 are well maintained; remaining integument appears unremarkable at this time. There are no open sores, no  preulcerative lesions, no rash or signs of infection present.  Vascular: Dorsalis Pedis artery and Posterior Tibial artery pedal pulses are 2/4 bilateral with immedate capillary fill time. Pedal hair growth present. No varicosities and no lower extremity edema present bilateral.   Neruologic: Grossly intact via light touch bilateral. Vibratory intact via tuning fork bilateral. Protective threshold with Semmes Wienstein monofilament intact to all pedal sites bilateral. Patellar and Achilles deep tendon reflexes 2+ bilateral. No Babinski or clonus noted bilateral.   Musculoskeletal: No gross boney pedal deformities bilateral. No pain, crepitus, or limitation noted with foot and ankle range of motion bilateral. Muscular strength 5/5 in all groups tested bilateral swollen painful foot right with edema and lymphedema..  Gait: Unassisted, Nonantalgic.    Radiographs:  Radiographs taken today demonstrate a midfoot fusion with one fractured screw.  It appears that the consolidation is starting to take place.  Assessment & Plan:   Assessment: Painful postop foot.  Lymphedema.  Plan: Methylprednisolone and Percocet were prescribed recommended compression hose once the methylprednisolone gets the fluid down.  I will follow-up with her in 1 month may need to consider lymphedema clinic.  I did explain explained to her that at 6 months a CT will be performed if pain level is still high.     Chelsea Lee, [place]

## 2018-12-14 DIAGNOSIS — E7849 Other hyperlipidemia: Secondary | ICD-10-CM | POA: Diagnosis not present

## 2018-12-14 DIAGNOSIS — R7309 Other abnormal glucose: Secondary | ICD-10-CM | POA: Diagnosis not present

## 2018-12-19 DIAGNOSIS — I10 Essential (primary) hypertension: Secondary | ICD-10-CM | POA: Diagnosis not present

## 2018-12-19 DIAGNOSIS — R630 Anorexia: Secondary | ICD-10-CM | POA: Diagnosis not present

## 2018-12-19 DIAGNOSIS — F419 Anxiety disorder, unspecified: Secondary | ICD-10-CM | POA: Diagnosis not present

## 2018-12-19 DIAGNOSIS — R531 Weakness: Secondary | ICD-10-CM | POA: Diagnosis not present

## 2018-12-19 DIAGNOSIS — R5383 Other fatigue: Secondary | ICD-10-CM | POA: Diagnosis not present

## 2018-12-19 DIAGNOSIS — Z20818 Contact with and (suspected) exposure to other bacterial communicable diseases: Secondary | ICD-10-CM | POA: Diagnosis not present

## 2018-12-19 DIAGNOSIS — R82998 Other abnormal findings in urine: Secondary | ICD-10-CM | POA: Diagnosis not present

## 2018-12-20 ENCOUNTER — Ambulatory Visit: Payer: Medicare Other | Admitting: Podiatry

## 2018-12-21 DIAGNOSIS — M79673 Pain in unspecified foot: Secondary | ICD-10-CM | POA: Diagnosis not present

## 2018-12-21 DIAGNOSIS — R7309 Other abnormal glucose: Secondary | ICD-10-CM | POA: Diagnosis not present

## 2018-12-21 DIAGNOSIS — E785 Hyperlipidemia, unspecified: Secondary | ICD-10-CM | POA: Diagnosis not present

## 2018-12-21 DIAGNOSIS — Z1331 Encounter for screening for depression: Secondary | ICD-10-CM | POA: Diagnosis not present

## 2018-12-21 DIAGNOSIS — Z0000 Encounter for general adult medical examination without abnormal findings: Secondary | ICD-10-CM | POA: Diagnosis not present

## 2018-12-21 DIAGNOSIS — R809 Proteinuria, unspecified: Secondary | ICD-10-CM | POA: Diagnosis not present

## 2018-12-21 DIAGNOSIS — Z20818 Contact with and (suspected) exposure to other bacterial communicable diseases: Secondary | ICD-10-CM | POA: Diagnosis not present

## 2018-12-21 DIAGNOSIS — F419 Anxiety disorder, unspecified: Secondary | ICD-10-CM | POA: Diagnosis not present

## 2018-12-21 DIAGNOSIS — G4700 Insomnia, unspecified: Secondary | ICD-10-CM | POA: Diagnosis not present

## 2018-12-21 DIAGNOSIS — N183 Chronic kidney disease, stage 3 (moderate): Secondary | ICD-10-CM | POA: Diagnosis not present

## 2018-12-22 ENCOUNTER — Ambulatory Visit: Payer: Medicare Other | Admitting: Podiatry

## 2019-01-10 ENCOUNTER — Other Ambulatory Visit: Payer: Self-pay

## 2019-01-10 ENCOUNTER — Ambulatory Visit (INDEPENDENT_AMBULATORY_CARE_PROVIDER_SITE_OTHER): Payer: Medicare Other | Admitting: Podiatry

## 2019-01-10 ENCOUNTER — Encounter: Payer: Self-pay | Admitting: Podiatry

## 2019-01-10 VITALS — Temp 97.8°F

## 2019-01-10 DIAGNOSIS — M779 Enthesopathy, unspecified: Secondary | ICD-10-CM | POA: Diagnosis not present

## 2019-01-10 DIAGNOSIS — M778 Other enthesopathies, not elsewhere classified: Secondary | ICD-10-CM

## 2019-01-10 NOTE — Progress Notes (Signed)
She presents today for follow-up of her capsulitis postop of her right foot's.  States that is better but is still not that great.  She states that she walked all over [facility] and the foot seem to be doing okay.  Objective: Vital signs are stable she is alert and oriented x3.  Pulses are palpable.  Midfoot fusion of the right foot is resulting in edema and possible nonunion.  Assessment: Slowly resolving surgical foot right.  Possible nonunion with considerable edema.  Plan: Discussed etiology pathology and surgical therapies at this point time went ahead and recommend she continue on with her conservative therapies and will follow-up with her in about 2 months at which time we may need to consider nonunion and look for bone stimulators.  May need to perform a CT CT scan.

## 2019-02-21 ENCOUNTER — Other Ambulatory Visit: Payer: Self-pay

## 2019-02-21 ENCOUNTER — Ambulatory Visit (INDEPENDENT_AMBULATORY_CARE_PROVIDER_SITE_OTHER): Payer: Medicare Other | Admitting: Neurology

## 2019-02-21 ENCOUNTER — Encounter: Payer: Self-pay | Admitting: Neurology

## 2019-02-21 DIAGNOSIS — Z820 Family history of epilepsy and other diseases of the nervous system: Secondary | ICD-10-CM

## 2019-02-21 NOTE — Progress Notes (Signed)
Reason for visit: Family history of Alzheimer's disease  Referring physician: Dr. [place] Chelsea Lee Chelsea Lee is a 73 y.o. female  History of present illness:  Chelsea Lee is a 74 year old right-handed white female with a history of Alzheimer's disease in a maternal aunt and in her mother.  She is concerned about this family history and comes in today for an evaluation.  Her mother lived to be age 57 and her aunt was 57 years of age at the time of death.  The patient herself has not noticed any problems with her memory.  She is able to operate a motor vehicle without difficulty, she has no problems with directions.  She is able to manage her own medications and appointments and do the finances.  She does not repeat herself or have problems with remembering names for people.  She does not misplace things about the house.  She indicates that she has good energy level, she sleeps well at night.  She reports no numbness or weakness of the face, arms, or legs, she does not have any balance issues.  She denies issues controlling the bowels or the bladder.  She comes to this office for an evaluation.  Past Medical History:  Diagnosis Date  . Anxiety   . Cervical radiculopathy   . CKD (chronic kidney disease)    stage 3  . Hearing loss   . HLD (hyperlipidemia)   . Obesity   . Osteoarthritis     Past Surgical History:  Procedure Laterality Date  . BREAST BIOPSY Left   . MENISCUS REPAIR Right 2008    Family History  Problem Relation Age of Onset  . Heart disease Father        age 61 death  . Alcohol abuse Father   . Diabetes Mother   . Dementia Mother     Social history:  reports that she has never smoked. She has never used smokeless tobacco. She reports current alcohol use. She reports that she does not use drugs.  Medications:  Prior to Admission medications   Medication Sig Start Date End Date Taking? Authorizing Provider  ALPRAZolam Duanne Moron) 1 MG tablet Take 1 tablet (1 mg  total) by mouth 3 (three) times daily as needed for anxiety. 01/10/15  Yes Milus Banister, MD  gabapentin (NEURONTIN) 100 MG capsule Take 100 mg by mouth 3 (three) times daily.   Yes [provider]  linaCLOtide (LINZESS PO) Take by mouth.   Yes [provider]  simvastatin (ZOCOR) 40 MG tablet Take 1 tablet (40 mg total) by mouth at bedtime. 01/10/15  Yes Milus Banister, MD     No Known Allergies  ROS:  Out of a complete 14 system review of symptoms, the patient complains only of the following symptoms, and all other reviewed systems are negative.  Right foot discomfort  Blood pressure 133/77, height 5\' 9"  (1.753 m), weight 206 lb (93.4 kg).  Physical Exam  General: The patient is alert and cooperative at the time of the examination.  Eyes: Pupils are equal, round, and reactive to light. Discs are flat bilaterally.  Neck: The neck is supple, no carotid bruits are noted.  Respiratory: The respiratory examination is clear.  Cardiovascular: The cardiovascular examination reveals a regular rate and rhythm, no obvious murmurs or rubs are noted.  Skin: Extremities are without significant edema, slight redness and warmth of the right foot is noted.  Neurologic Exam  Mental status: The patient is alert and  oriented x 3 at the time of the examination. The patient has apparent normal recent and remote memory, with an apparently normal attention span and concentration ability.  Mini-Mental status examination done today shows a total score 29/30.  Cranial nerves: Facial symmetry is present. There is good sensation of the face to pinprick and soft touch bilaterally. The strength of the facial muscles and the muscles to head turning and shoulder shrug are normal bilaterally. Speech is well enunciated, no aphasia or dysarthria is noted. Extraocular movements are full. Visual fields are full. The tongue is midline, and the patient has symmetric elevation of the soft palate. No  obvious hearing deficits are noted.  Motor: The motor testing reveals 5 over 5 strength of all 4 extremities. Good symmetric motor tone is noted throughout.  Sensory: Sensory testing is intact to pinprick, soft touch, vibration sensation, and position sense on all 4 extremities. No evidence of extinction is noted.  Coordination: Cerebellar testing reveals good finger-nose-finger and heel-to-shin bilaterally.  Gait and station: Gait is normal. Tandem gait is normal. Romberg is negative. No drift is seen.  Reflexes: Deep tendon reflexes are symmetric, but are slightly depressed bilaterally. Toes are downgoing bilaterally.   Assessment/Plan:  1.  Family history of Alzheimer's disease  The patient is concerned about her family history, but both members on her mother side with Alzheimer's lived into their mid 56s.  At this age, the 48 of the population does have Alzheimer's disease.  I have indicated that the patient is to remain physically active, maintain a good sleep regimen and try to remain cognitively active.  I would not recommend any medications for memory at this time.  She will call me if she has any concerns.  Greater than 50% of the visit was spent in counseling and coordination of care.  Face-to-face time with the patient was 30 minutes.   Jill Alexanders MD 02/21/2019 11:19 AM  [facility] [address] [place], [place] [address]  Phone [phone] Fax [phone]

## 2019-02-28 ENCOUNTER — Encounter: Payer: Self-pay | Admitting: Podiatry

## 2019-02-28 ENCOUNTER — Ambulatory Visit (INDEPENDENT_AMBULATORY_CARE_PROVIDER_SITE_OTHER): Payer: Medicare Other | Admitting: Podiatry

## 2019-02-28 ENCOUNTER — Other Ambulatory Visit: Payer: Self-pay

## 2019-02-28 DIAGNOSIS — I890 Lymphedema, not elsewhere classified: Secondary | ICD-10-CM | POA: Diagnosis not present

## 2019-02-28 DIAGNOSIS — G8918 Other acute postprocedural pain: Secondary | ICD-10-CM

## 2019-02-28 DIAGNOSIS — M779 Enthesopathy, unspecified: Secondary | ICD-10-CM

## 2019-02-28 DIAGNOSIS — M778 Other enthesopathies, not elsewhere classified: Secondary | ICD-10-CM

## 2019-02-28 MED ORDER — GABAPENTIN 300 MG PO CAPS: 300.0000 mg | ORAL_CAPSULE | Freq: Three times a day (TID) | ORAL | 3 refills | Status: DC

## 2019-02-28 NOTE — Progress Notes (Signed)
She presents today for follow-up of her surgical foot right that was performed in [place].  States that is staying red and swollen and painful all of the time now.  Objective: Vital signs are stable she is alert and oriented x3.  Pulses are palpable.  Neurologic sensorium is intact there is considerable edema to the right foot and painful on attempted range of motion.  Assessment: This Lisfranc fusion is nonhealing and at this point really needs to have a CT looking for a nonunion or possible redo of the fusion.  Plan:  CT for suspect non-union

## 2019-03-01 ENCOUNTER — Telehealth: Payer: Self-pay | Admitting: *Deleted

## 2019-03-01 DIAGNOSIS — Z23 Encounter for immunization: Secondary | ICD-10-CM | POA: Diagnosis not present

## 2019-03-01 DIAGNOSIS — M778 Other enthesopathies, not elsewhere classified: Secondary | ICD-10-CM

## 2019-03-01 NOTE — Telephone Encounter (Signed)
-----   Message from Rip Harbour, [facility] sent at 02/28/2019  4:04 PM EDT ----- Regarding: CT CT right foot - s/p lisfranc fusion, evaluate nonunion right

## 2019-03-01 NOTE — Telephone Encounter (Signed)
Faxed orders to [facility]. 

## 2019-03-08 ENCOUNTER — Telehealth: Payer: Self-pay | Admitting: *Deleted

## 2019-03-08 DIAGNOSIS — H524 Presbyopia: Secondary | ICD-10-CM | POA: Diagnosis not present

## 2019-03-08 DIAGNOSIS — H2640 Unspecified secondary cataract: Secondary | ICD-10-CM | POA: Diagnosis not present

## 2019-03-08 DIAGNOSIS — H04123 Dry eye syndrome of bilateral lacrimal glands: Secondary | ICD-10-CM | POA: Diagnosis not present

## 2019-03-08 DIAGNOSIS — H35033 Hypertensive retinopathy, bilateral: Secondary | ICD-10-CM | POA: Diagnosis not present

## 2019-03-08 DIAGNOSIS — H35363 Drusen (degenerative) of macula, bilateral: Secondary | ICD-10-CM | POA: Diagnosis not present

## 2019-03-08 DIAGNOSIS — Z961 Presence of intraocular lens: Secondary | ICD-10-CM | POA: Diagnosis not present

## 2019-03-08 NOTE — Telephone Encounter (Signed)
I informed pt the CT order had been ordered 03/01/2019, and generally [facility] will call the pt to schedule, but they may be behind in scheduling, so if she would like to help them she could call to schedule. I informed pt of [facility] [phone].

## 2019-03-08 NOTE — Telephone Encounter (Signed)
Pt states she was seen by Dr. Milinda Pointer 02/28/2019 and was to have a CT ordered, but has not heard from anyone.

## 2019-03-13 ENCOUNTER — Ambulatory Visit
Admission: RE | Admit: 2019-03-13 | Discharge: 2019-03-13 | Disposition: A | Discharge status: Home / Self Care | Payer: Medicare Other | Source: Ambulatory Visit | Attending: Podiatry | Admitting: Podiatry

## 2019-03-13 DIAGNOSIS — M778 Other enthesopathies, not elsewhere classified: Secondary | ICD-10-CM

## 2019-03-13 DIAGNOSIS — Z981 Arthrodesis status: Secondary | ICD-10-CM | POA: Diagnosis not present

## 2019-03-13 DIAGNOSIS — M810 Age-related osteoporosis without current pathological fracture: Secondary | ICD-10-CM | POA: Diagnosis not present

## 2019-03-13 DIAGNOSIS — M19071 Primary osteoarthritis, right ankle and foot: Secondary | ICD-10-CM | POA: Diagnosis not present

## 2019-03-20 ENCOUNTER — Telehealth: Payer: Self-pay | Admitting: *Deleted

## 2019-03-20 NOTE — Telephone Encounter (Signed)
Looks like fusion have done well.  May need to remove internal fixation but must wait until she is a year out.  You may have her in for discussion if she desires.

## 2019-03-20 NOTE — Telephone Encounter (Signed)
I informed pt of Dr. Stephenie Acres review of the CT and recommendations. Pt states she may call later to schedule.

## 2019-03-20 NOTE — Telephone Encounter (Signed)
Pt request CT results.

## 2019-04-11 DIAGNOSIS — M25551 Pain in right hip: Secondary | ICD-10-CM | POA: Diagnosis not present

## 2019-04-20 DIAGNOSIS — M79661 Pain in right lower leg: Secondary | ICD-10-CM | POA: Diagnosis not present

## 2019-04-26 DIAGNOSIS — M25551 Pain in right hip: Secondary | ICD-10-CM | POA: Diagnosis not present

## 2019-05-02 DIAGNOSIS — M7061 Trochanteric bursitis, right hip: Secondary | ICD-10-CM | POA: Diagnosis not present

## 2019-05-15 DIAGNOSIS — H35363 Drusen (degenerative) of macula, bilateral: Secondary | ICD-10-CM | POA: Diagnosis not present

## 2019-05-15 DIAGNOSIS — H35033 Hypertensive retinopathy, bilateral: Secondary | ICD-10-CM | POA: Diagnosis not present

## 2019-05-15 DIAGNOSIS — H26491 Other secondary cataract, right eye: Secondary | ICD-10-CM | POA: Diagnosis not present

## 2019-05-15 DIAGNOSIS — H35013 Changes in retinal vascular appearance, bilateral: Secondary | ICD-10-CM | POA: Diagnosis not present

## 2019-05-23 ENCOUNTER — Other Ambulatory Visit: Payer: Self-pay | Admitting: *Deleted

## 2019-05-23 DIAGNOSIS — H04123 Dry eye syndrome of bilateral lacrimal glands: Secondary | ICD-10-CM | POA: Diagnosis not present

## 2019-05-23 DIAGNOSIS — T1501XA Foreign body in cornea, right eye, initial encounter: Secondary | ICD-10-CM | POA: Diagnosis not present

## 2019-05-23 DIAGNOSIS — H16223 Keratoconjunctivitis sicca, not specified as Sjogren's, bilateral: Secondary | ICD-10-CM | POA: Diagnosis not present

## 2019-05-23 MED ORDER — GABAPENTIN 300 MG PO CAPS: 300.0000 mg | ORAL_CAPSULE | Freq: Three times a day (TID) | ORAL | 3 refills | Status: DC

## 2019-05-23 NOTE — Telephone Encounter (Signed)
Requesting refill for gabapentin

## 2019-09-28 DIAGNOSIS — L570 Actinic keratosis: Secondary | ICD-10-CM | POA: Diagnosis not present

## 2019-09-28 DIAGNOSIS — C44319 Basal cell carcinoma of skin of other parts of face: Secondary | ICD-10-CM | POA: Diagnosis not present

## 2019-09-28 DIAGNOSIS — L821 Other seborrheic keratosis: Secondary | ICD-10-CM | POA: Diagnosis not present

## 2019-09-28 DIAGNOSIS — Z85828 Personal history of other malignant neoplasm of skin: Secondary | ICD-10-CM | POA: Diagnosis not present

## 2019-09-28 DIAGNOSIS — L814 Other melanin hyperpigmentation: Secondary | ICD-10-CM | POA: Diagnosis not present

## 2019-10-18 DIAGNOSIS — C44319 Basal cell carcinoma of skin of other parts of face: Secondary | ICD-10-CM | POA: Diagnosis not present

## 2019-10-18 DIAGNOSIS — Z85828 Personal history of other malignant neoplasm of skin: Secondary | ICD-10-CM | POA: Diagnosis not present

## 2019-11-14 DIAGNOSIS — H35363 Drusen (degenerative) of macula, bilateral: Secondary | ICD-10-CM | POA: Diagnosis not present

## 2019-11-14 DIAGNOSIS — H35013 Changes in retinal vascular appearance, bilateral: Secondary | ICD-10-CM | POA: Diagnosis not present

## 2019-11-14 DIAGNOSIS — H35033 Hypertensive retinopathy, bilateral: Secondary | ICD-10-CM | POA: Diagnosis not present

## 2019-11-14 DIAGNOSIS — Z961 Presence of intraocular lens: Secondary | ICD-10-CM | POA: Diagnosis not present

## 2019-12-22 DIAGNOSIS — R739 Hyperglycemia, unspecified: Secondary | ICD-10-CM | POA: Diagnosis not present

## 2019-12-22 DIAGNOSIS — E7849 Other hyperlipidemia: Secondary | ICD-10-CM | POA: Diagnosis not present

## 2019-12-22 DIAGNOSIS — R7309 Other abnormal glucose: Secondary | ICD-10-CM | POA: Diagnosis not present

## 2019-12-25 DIAGNOSIS — Z1212 Encounter for screening for malignant neoplasm of rectum: Secondary | ICD-10-CM | POA: Diagnosis not present

## 2019-12-26 DIAGNOSIS — E7849 Other hyperlipidemia: Secondary | ICD-10-CM | POA: Diagnosis not present

## 2019-12-26 DIAGNOSIS — R82998 Other abnormal findings in urine: Secondary | ICD-10-CM | POA: Diagnosis not present

## 2020-01-18 DIAGNOSIS — H9190 Unspecified hearing loss, unspecified ear: Secondary | ICD-10-CM | POA: Diagnosis not present

## 2020-01-18 DIAGNOSIS — Z0000 Encounter for general adult medical examination without abnormal findings: Secondary | ICD-10-CM | POA: Diagnosis not present

## 2020-01-18 DIAGNOSIS — G4700 Insomnia, unspecified: Secondary | ICD-10-CM | POA: Diagnosis not present

## 2020-01-18 DIAGNOSIS — M545 Low back pain: Secondary | ICD-10-CM | POA: Diagnosis not present

## 2020-01-18 DIAGNOSIS — R7989 Other specified abnormal findings of blood chemistry: Secondary | ICD-10-CM | POA: Diagnosis not present

## 2020-01-18 DIAGNOSIS — F419 Anxiety disorder, unspecified: Secondary | ICD-10-CM | POA: Diagnosis not present

## 2020-01-18 DIAGNOSIS — M19049 Primary osteoarthritis, unspecified hand: Secondary | ICD-10-CM | POA: Diagnosis not present

## 2020-01-18 DIAGNOSIS — M79671 Pain in right foot: Secondary | ICD-10-CM | POA: Diagnosis not present

## 2020-01-18 DIAGNOSIS — K5904 Chronic idiopathic constipation: Secondary | ICD-10-CM | POA: Diagnosis not present

## 2020-01-18 DIAGNOSIS — E669 Obesity, unspecified: Secondary | ICD-10-CM | POA: Diagnosis not present

## 2020-01-18 DIAGNOSIS — E785 Hyperlipidemia, unspecified: Secondary | ICD-10-CM | POA: Diagnosis not present

## 2020-03-04 DIAGNOSIS — Z23 Encounter for immunization: Secondary | ICD-10-CM | POA: Diagnosis not present

## 2020-03-12 DIAGNOSIS — Z23 Encounter for immunization: Secondary | ICD-10-CM | POA: Diagnosis not present

## 2020-03-27 DIAGNOSIS — H903 Sensorineural hearing loss, bilateral: Secondary | ICD-10-CM | POA: Diagnosis not present

## 2020-04-02 DIAGNOSIS — G5712 Meralgia paresthetica, left lower limb: Secondary | ICD-10-CM | POA: Diagnosis not present

## 2020-04-02 DIAGNOSIS — I10 Essential (primary) hypertension: Secondary | ICD-10-CM | POA: Diagnosis not present

## 2020-04-02 DIAGNOSIS — E669 Obesity, unspecified: Secondary | ICD-10-CM | POA: Diagnosis not present

## 2020-04-02 DIAGNOSIS — F419 Anxiety disorder, unspecified: Secondary | ICD-10-CM | POA: Diagnosis not present

## 2020-04-02 DIAGNOSIS — M545 Low back pain, unspecified: Secondary | ICD-10-CM | POA: Diagnosis not present

## 2020-04-02 DIAGNOSIS — Z1231 Encounter for screening mammogram for malignant neoplasm of breast: Secondary | ICD-10-CM | POA: Diagnosis not present

## 2020-04-02 DIAGNOSIS — M79671 Pain in right foot: Secondary | ICD-10-CM | POA: Diagnosis not present

## 2020-04-12 ENCOUNTER — Other Ambulatory Visit: Payer: Self-pay | Admitting: Internal Medicine

## 2020-04-12 DIAGNOSIS — Z1231 Encounter for screening mammogram for malignant neoplasm of breast: Secondary | ICD-10-CM

## 2020-04-17 DIAGNOSIS — R197 Diarrhea, unspecified: Secondary | ICD-10-CM | POA: Diagnosis not present

## 2020-04-17 DIAGNOSIS — K5904 Chronic idiopathic constipation: Secondary | ICD-10-CM | POA: Diagnosis not present

## 2020-04-17 DIAGNOSIS — G5712 Meralgia paresthetica, left lower limb: Secondary | ICD-10-CM | POA: Diagnosis not present

## 2020-04-17 DIAGNOSIS — M545 Low back pain, unspecified: Secondary | ICD-10-CM | POA: Diagnosis not present

## 2020-06-06 ENCOUNTER — Encounter (HOSPITAL_COMMUNITY): Payer: Self-pay | Admitting: Emergency Medicine

## 2020-06-06 ENCOUNTER — Ambulatory Visit (HOSPITAL_COMMUNITY)
Admission: EM | Admit: 2020-06-06 | Discharge: 2020-06-06 | Disposition: A | Discharge status: Home / Self Care | Payer: Medicare Other | Attending: Family Medicine | Admitting: Family Medicine

## 2020-06-06 ENCOUNTER — Other Ambulatory Visit: Payer: Self-pay

## 2020-06-06 DIAGNOSIS — B349 Viral infection, unspecified: Secondary | ICD-10-CM

## 2020-06-06 DIAGNOSIS — Z20822 Contact with and (suspected) exposure to covid-19: Secondary | ICD-10-CM | POA: Diagnosis not present

## 2020-06-06 DIAGNOSIS — Z79899 Other long term (current) drug therapy: Secondary | ICD-10-CM | POA: Diagnosis not present

## 2020-06-06 DIAGNOSIS — R5383 Other fatigue: Secondary | ICD-10-CM | POA: Diagnosis not present

## 2020-06-06 DIAGNOSIS — R197 Diarrhea, unspecified: Secondary | ICD-10-CM | POA: Insufficient documentation

## 2020-06-06 LAB — SARS CORONAVIRUS 2 (TAT 6-24 HRS): SARS Coronavirus 2: NEGATIVE

## 2020-06-06 NOTE — ED Provider Notes (Signed)
[facility]    CSN: [id number] Arrival date & time: 06/06/20  1413      History   Chief Complaint Chief Complaint  Patient presents with  . URI    HPI Chelsea Lee is a 74 y.o. female.   HPI Patient states she said a viral infection for about a week.  She has mostly had diarrhea and some fatigue.  She states she has been controlling this with diet.  She did not eat for 3 days and then started back on a bland diet.  She has had no fever chills.  No headache or body ache.  Her husband became sick 4 days ago, and the husband called his primary care doctor who recommended he be seen for Covid testing.  The husband said "if I am going you are going".  This is the reason patient states that she is here.  She states she is improving Past Medical History:  Diagnosis Date  . Anxiety   . Cervical radiculopathy   . CKD (chronic kidney disease)    stage 3  . Hearing loss   . HLD (hyperlipidemia)   . Obesity   . Osteoarthritis     Patient Active Problem List   Diagnosis Date Noted  . Family history of Alzheimer's disease 02/21/2019  . Primary osteoarthritis of right foot 03/09/2017    Past Surgical History:  Procedure Laterality Date  . BREAST BIOPSY Left   . MENISCUS REPAIR Right 2008    OB History   No obstetric history on file.      Home Medications    Prior to Admission medications   Medication Sig Start Date End Date Taking? Authorizing Provider  ALPRAZolam Duanne Moron) 1 MG tablet Take 1 tablet (1 mg total) by mouth 3 (three) times daily as needed for anxiety. 01/10/15  Yes Milus Banister, MD  gabapentin (NEURONTIN) 300 MG capsule Take 1 capsule (300 mg total) by mouth 3 (three) times daily. 05/23/19  Yes Hyatt, Max T, DPM  linaCLOtide (LINZESS PO) Take by mouth.   Yes [provider]  simvastatin (ZOCOR) 40 MG tablet Take 1 tablet (40 mg total) by mouth at bedtime. 01/10/15  Yes Milus Banister, MD    Family History Family History  Problem  Relation Age of Onset  . Heart disease Father        age 16 death  . Alcohol abuse Father   . Diabetes Mother   . Dementia Mother     Social History Social History   Tobacco Use  . Smoking status: Never Smoker  . Smokeless tobacco: Never Used  Substance Use Topics  . Alcohol use: Yes    Alcohol/week: 0.0 standard drinks    Comment: social  . Drug use: No     Allergies   Patient has no known allergies.   Review of Systems Review of Systems See HPI  Physical Exam Triage Vital Signs ED Triage Vitals  Enc Vitals Group     BP 06/06/20 1645 (!) 142/66     Pulse Rate 06/06/20 1645 64     Resp 06/06/20 1645 16     Temp 06/06/20 1645 98.4 F (36.9 C)     Temp Source 06/06/20 1645 Oral     SpO2 06/06/20 1645 100 %     Weight --      Height --      Head Circumference --      Peak Flow --      Pain  Score 06/06/20 1643 0     Pain Loc --      Pain Edu? --      Excl. in GC? --    No data found.  Updated Vital Signs BP (!) 142/66 (BP Location: Right Arm)   Pulse 64   Temp 98.4 F (36.9 C) (Oral)   Resp 16   SpO2 100%   Physical Exam Constitutional:      General: She is not in acute distress.    Appearance: She is well-developed and well-nourished.     Comments: Exam by observation  HENT:     Head: Normocephalic and atraumatic.     Mouth/Throat:     Mouth: Oropharynx is clear and moist.     Comments: Mask is in place Eyes:     Conjunctiva/sclera: Conjunctivae normal.     Pupils: Pupils are equal, round, and reactive to light.  Cardiovascular:     Rate and Rhythm: Normal rate.  Pulmonary:     Effort: Pulmonary effort is normal. No respiratory distress.  Abdominal:     General: There is no distension.     Palpations: Abdomen is soft.  Musculoskeletal:        General: No edema. Normal range of motion.     Cervical back: Normal range of motion.  Skin:    General: Skin is warm and dry.  Neurological:     Mental Status: She is alert.   Patient appears  well. Covid testing done at her request   UC Treatments / Results  Labs (all labs ordered are listed, but only abnormal results are displayed) Labs Reviewed  SARS CORONAVIRUS 2 (TAT 6-24 HRS)    EKG   Radiology No results found.  Procedures Procedures (including critical care time)  Medications Ordered in UC Medications - No data to display  Initial Impression / Assessment and Plan / UC Course  I have reviewed the triage vital signs and the nursing notes.  Pertinent labs & imaging results that were available during my care of the patient were reviewed by me and considered in my medical decision making (see chart for details).      Final Clinical Impressions(s) / UC Diagnoses   Final diagnoses:  Viral syndrome     Discharge Instructions     Check MyChart for your test results Drink plenty of water I expect the diarrhea will improve over the next several days It is okay to take over-the-counter Pepto-Bismol or Imodium if the diarrhea is problematic    ED Prescriptions    None     PDMP not reviewed this encounter.   Eustace Moore, MD 06/06/20 [phone]

## 2020-06-06 NOTE — Discharge Instructions (Addendum)
Check MyChart for your test results Drink plenty of water I expect the diarrhea will improve over the next several days It is okay to take over-the-counter Pepto-Bismol or Imodium if the diarrhea is problematic

## 2020-06-06 NOTE — ED Triage Notes (Signed)
PT C/O: cold sx onset 1 week associated w/cough and diarrhea  Needing to be tested for COVID  DENIES: fevers, vomiting, SOB   TAKING MEDS: OTC Pepto-Bismol W/temp relief.   A&O x4... NAD... Ambulatory

## 2020-06-12 DIAGNOSIS — R201 Hypoesthesia of skin: Secondary | ICD-10-CM | POA: Diagnosis not present

## 2020-06-12 DIAGNOSIS — Z85828 Personal history of other malignant neoplasm of skin: Secondary | ICD-10-CM | POA: Diagnosis not present

## 2020-06-20 DIAGNOSIS — D485 Neoplasm of uncertain behavior of skin: Secondary | ICD-10-CM | POA: Diagnosis not present

## 2020-06-21 DIAGNOSIS — C44319 Basal cell carcinoma of skin of other parts of face: Secondary | ICD-10-CM | POA: Diagnosis not present

## 2020-06-21 DIAGNOSIS — J342 Deviated nasal septum: Secondary | ICD-10-CM | POA: Diagnosis not present

## 2020-06-21 DIAGNOSIS — G319 Degenerative disease of nervous system, unspecified: Secondary | ICD-10-CM | POA: Diagnosis not present

## 2020-06-21 DIAGNOSIS — C4491 Basal cell carcinoma of skin, unspecified: Secondary | ICD-10-CM | POA: Diagnosis not present

## 2020-06-21 DIAGNOSIS — I6523 Occlusion and stenosis of bilateral carotid arteries: Secondary | ICD-10-CM | POA: Diagnosis not present

## 2020-07-31 DIAGNOSIS — I10 Essential (primary) hypertension: Secondary | ICD-10-CM | POA: Diagnosis not present

## 2020-07-31 DIAGNOSIS — M79671 Pain in right foot: Secondary | ICD-10-CM | POA: Diagnosis not present

## 2020-07-31 DIAGNOSIS — F419 Anxiety disorder, unspecified: Secondary | ICD-10-CM | POA: Diagnosis not present

## 2020-07-31 DIAGNOSIS — M545 Low back pain, unspecified: Secondary | ICD-10-CM | POA: Diagnosis not present

## 2020-07-31 DIAGNOSIS — E669 Obesity, unspecified: Secondary | ICD-10-CM | POA: Diagnosis not present

## 2020-08-09 DIAGNOSIS — M545 Low back pain, unspecified: Secondary | ICD-10-CM | POA: Diagnosis not present

## 2020-08-09 DIAGNOSIS — M25561 Pain in right knee: Secondary | ICD-10-CM | POA: Diagnosis not present

## 2020-08-09 DIAGNOSIS — M1711 Unilateral primary osteoarthritis, right knee: Secondary | ICD-10-CM | POA: Diagnosis not present

## 2020-08-09 DIAGNOSIS — M5441 Lumbago with sciatica, right side: Secondary | ICD-10-CM | POA: Diagnosis not present

## 2020-08-20 DIAGNOSIS — M1711 Unilateral primary osteoarthritis, right knee: Secondary | ICD-10-CM | POA: Diagnosis not present

## 2020-08-20 DIAGNOSIS — S76911D Strain of unspecified muscles, fascia and tendons at thigh level, right thigh, subsequent encounter: Secondary | ICD-10-CM | POA: Diagnosis not present

## 2020-08-20 DIAGNOSIS — M6281 Muscle weakness (generalized): Secondary | ICD-10-CM | POA: Diagnosis not present

## 2020-08-31 DIAGNOSIS — M1711 Unilateral primary osteoarthritis, right knee: Secondary | ICD-10-CM | POA: Diagnosis not present

## 2020-08-31 DIAGNOSIS — M79651 Pain in right thigh: Secondary | ICD-10-CM | POA: Diagnosis not present

## 2020-09-11 DIAGNOSIS — C4491 Basal cell carcinoma of skin, unspecified: Secondary | ICD-10-CM | POA: Diagnosis not present

## 2020-09-11 DIAGNOSIS — R200 Anesthesia of skin: Secondary | ICD-10-CM | POA: Diagnosis not present

## 2020-09-11 DIAGNOSIS — G319 Degenerative disease of nervous system, unspecified: Secondary | ICD-10-CM | POA: Diagnosis not present

## 2020-10-02 DIAGNOSIS — M79651 Pain in right thigh: Secondary | ICD-10-CM | POA: Diagnosis not present

## 2020-10-02 DIAGNOSIS — M545 Low back pain, unspecified: Secondary | ICD-10-CM | POA: Diagnosis not present

## 2020-10-02 DIAGNOSIS — M1711 Unilateral primary osteoarthritis, right knee: Secondary | ICD-10-CM | POA: Diagnosis not present

## 2020-10-05 DIAGNOSIS — M545 Low back pain, unspecified: Secondary | ICD-10-CM | POA: Diagnosis not present

## 2020-10-09 ENCOUNTER — Encounter (HOSPITAL_COMMUNITY): Payer: Self-pay | Admitting: Emergency Medicine

## 2020-10-09 DIAGNOSIS — M1711 Unilateral primary osteoarthritis, right knee: Secondary | ICD-10-CM | POA: Diagnosis not present

## 2020-10-14 DIAGNOSIS — M47816 Spondylosis without myelopathy or radiculopathy, lumbar region: Secondary | ICD-10-CM | POA: Diagnosis not present

## 2020-10-16 DIAGNOSIS — M1711 Unilateral primary osteoarthritis, right knee: Secondary | ICD-10-CM | POA: Diagnosis not present

## 2020-10-17 DIAGNOSIS — Z23 Encounter for immunization: Secondary | ICD-10-CM | POA: Diagnosis not present

## 2020-10-19 ENCOUNTER — Other Ambulatory Visit: Payer: Self-pay | Admitting: Podiatry

## 2020-10-21 NOTE — Telephone Encounter (Signed)
Please advise 

## 2020-10-30 DIAGNOSIS — M1711 Unilateral primary osteoarthritis, right knee: Secondary | ICD-10-CM | POA: Diagnosis not present

## 2020-11-11 ENCOUNTER — Ambulatory Visit
Admission: RE | Admit: 2020-11-11 | Discharge: 2020-11-11 | Disposition: A | Discharge status: Home / Self Care | Payer: Medicare Other | Source: Ambulatory Visit | Attending: Internal Medicine | Admitting: Internal Medicine

## 2020-11-11 ENCOUNTER — Other Ambulatory Visit: Payer: Self-pay

## 2020-11-11 DIAGNOSIS — Z1231 Encounter for screening mammogram for malignant neoplasm of breast: Secondary | ICD-10-CM | POA: Diagnosis not present

## 2020-11-14 ENCOUNTER — Other Ambulatory Visit: Payer: Self-pay | Admitting: Internal Medicine

## 2020-11-14 DIAGNOSIS — R928 Other abnormal and inconclusive findings on diagnostic imaging of breast: Secondary | ICD-10-CM

## 2020-12-06 DIAGNOSIS — M1711 Unilateral primary osteoarthritis, right knee: Secondary | ICD-10-CM | POA: Diagnosis not present

## 2021-01-08 ENCOUNTER — Ambulatory Visit: Payer: Medicare Other

## 2021-01-08 ENCOUNTER — Ambulatory Visit
Admission: RE | Admit: 2021-01-08 | Discharge: 2021-01-08 | Disposition: A | Discharge status: Home / Self Care | Payer: Medicare Other | Source: Ambulatory Visit | Attending: Internal Medicine | Admitting: Internal Medicine

## 2021-01-08 ENCOUNTER — Other Ambulatory Visit: Payer: Self-pay

## 2021-01-08 ENCOUNTER — Other Ambulatory Visit: Payer: Self-pay | Admitting: Internal Medicine

## 2021-01-08 DIAGNOSIS — R922 Inconclusive mammogram: Secondary | ICD-10-CM | POA: Diagnosis not present

## 2021-01-08 DIAGNOSIS — R921 Mammographic calcification found on diagnostic imaging of breast: Secondary | ICD-10-CM | POA: Diagnosis not present

## 2021-01-08 DIAGNOSIS — R928 Other abnormal and inconclusive findings on diagnostic imaging of breast: Secondary | ICD-10-CM

## 2021-01-10 ENCOUNTER — Other Ambulatory Visit: Payer: Medicare Other

## 2021-01-13 ENCOUNTER — Ambulatory Visit
Admission: RE | Admit: 2021-01-13 | Discharge: 2021-01-13 | Disposition: A | Discharge status: Home / Self Care | Payer: Medicare Other | Source: Ambulatory Visit | Attending: Internal Medicine | Admitting: Internal Medicine

## 2021-01-13 ENCOUNTER — Other Ambulatory Visit: Payer: Self-pay

## 2021-01-13 DIAGNOSIS — Z170 Estrogen receptor positive status [ER+]: Secondary | ICD-10-CM | POA: Diagnosis not present

## 2021-01-13 DIAGNOSIS — C50411 Malignant neoplasm of upper-outer quadrant of right female breast: Secondary | ICD-10-CM | POA: Diagnosis not present

## 2021-01-13 DIAGNOSIS — R928 Other abnormal and inconclusive findings on diagnostic imaging of breast: Secondary | ICD-10-CM

## 2021-01-13 DIAGNOSIS — N6311 Unspecified lump in the right breast, upper outer quadrant: Secondary | ICD-10-CM | POA: Diagnosis not present

## 2021-01-13 DIAGNOSIS — N6011 Diffuse cystic mastopathy of right breast: Secondary | ICD-10-CM | POA: Diagnosis not present

## 2021-01-13 DIAGNOSIS — N6312 Unspecified lump in the right breast, upper inner quadrant: Secondary | ICD-10-CM | POA: Diagnosis not present

## 2021-01-15 ENCOUNTER — Telehealth: Payer: Self-pay | Admitting: Oncology

## 2021-01-15 NOTE — Telephone Encounter (Signed)
Spoke with patient to confirm morning [facility] appointment for 8/17, packet will be emailed to patient

## 2021-01-16 ENCOUNTER — Encounter: Payer: Self-pay | Admitting: *Deleted

## 2021-01-20 ENCOUNTER — Other Ambulatory Visit: Payer: Self-pay | Admitting: *Deleted

## 2021-01-20 DIAGNOSIS — C50411 Malignant neoplasm of upper-outer quadrant of right female breast: Secondary | ICD-10-CM | POA: Insufficient documentation

## 2021-01-20 DIAGNOSIS — I10 Essential (primary) hypertension: Secondary | ICD-10-CM | POA: Diagnosis not present

## 2021-01-20 DIAGNOSIS — Z170 Estrogen receptor positive status [ER+]: Secondary | ICD-10-CM

## 2021-01-20 DIAGNOSIS — E785 Hyperlipidemia, unspecified: Secondary | ICD-10-CM | POA: Diagnosis not present

## 2021-01-20 DIAGNOSIS — R7989 Other specified abnormal findings of blood chemistry: Secondary | ICD-10-CM | POA: Diagnosis not present

## 2021-01-21 NOTE — Progress Notes (Signed)
Radiation Oncology         [phone]) [phone] ________________________________  Multidisciplinary Breast Oncology Clinic [facility]) Initial Outpatient Consultation  Name: Chelsea Lee MRN: [id number]  Date: 01/22/2021  DOB: 07-Jan-1946  CC:Velna Hatchet, MD  Coralie Keens, MD   REFERRING PHYSICIAN: Coralie Keens, MD  DIAGNOSIS: The encounter diagnosis was Malignant neoplasm of upper-outer quadrant of right breast in female, estrogen receptor positive ([place]).  Stage IB (cT2, cN0, cM0),  Right Breast UOQ, Invasive Ductal Carcinoma with DCIS, ER+ / PR+ / Her2-, Grade 2    ICD-10-CM   1. Malignant neoplasm of upper-outer quadrant of right breast in female, estrogen receptor positive ([place])  C50.411    Z17.0       HISTORY OF PRESENT ILLNESS::Chelsea Lee is a 75 y.o. female who is presenting to the office today for evaluation of her newly diagnosed breast cancer. She is accompanied by her husband and daughter who lives in [place]. She is doing well overall.   She had routine screening mammography on 11/11/20 showing a possible abnormality in the right breast. She underwent bilateral diagnostic mammography with tomography and right breast ultrasonography at [facility] on 01/08/21 showing: a highly suspicious 2.6 cm posterior upper outer right breast mass (no abnormal right axillary nodes were visualized). Additionally, likely benign complex cystic changes within the inner right breast were visualized. Of note: some benign left breast calcifications were seen.   Right breast biopsy at the 11 o'clock position on 01/13/21 showed: grade 2 invasive ductal carcinoma, measuring 9 mm in the greatest linear extent; and ductal carcinoma in situ with necrosis. Additional biopsy performed at 2:30 o'clock region of the right breast revealed fibrocystic changes with apocrine metaplasia. Prognostic indicators significant for: estrogen receptor, >95% positive and progesterone receptor, >95%  positive , both with strong staining intensity. Proliferation marker Ki67 at 15%. HER2 negative.  Menarche: 75 years old Age at first live birth: 75 years old GP: 2 LMP: No longer having periods, unsure of when last one was Contraceptive: has used in past, states started using when she was 3 but cant recall when she stopped (first birth at 1) HRT: None   The patient was referred today for presentation in the multidisciplinary conference.  Radiology studies and pathology slides were presented there for review and discussion of treatment options.  A consensus was discussed regarding potential next steps.  PREVIOUS RADIATION THERAPY: Yes for skin cancer along the forehead, [place]; reports having 20 treatments.  PAST MEDICAL HISTORY:  Past Medical History:  Diagnosis Date   Anxiety    Breast cancer ([place])    Cervical radiculopathy    CKD (chronic kidney disease)    stage 3   Hearing loss    HLD (hyperlipidemia)    Obesity    Osteoarthritis     PAST SURGICAL HISTORY: Past Surgical History:  Procedure Laterality Date   BREAST BIOPSY Left    MENISCUS REPAIR Right 2008    FAMILY HISTORY: No family history of breast or ovarian cancer Family History  Problem Relation Age of Onset   Diabetes Mother    Dementia Mother    Heart disease Father        age 67 death   Alcohol abuse Father     SOCIAL HISTORY:  Social History   Socioeconomic History   Marital status: Married    Spouse name: Not on file   Number of children: Not on file   Years  of education: Not on file   Highest education level: Not on file  Occupational History   Occupation: Retired  Tobacco Use   Smoking status: Never   Smokeless tobacco: Never  Substance and Sexual Activity   Alcohol use: Yes    Alcohol/week: 0.0 standard drinks    Comment: social   Drug use: No   Sexual activity: Not on file  Other Topics Concern   Not on file  Social History Narrative    ** Merged History Encounter **       Right handed  Caffeine~ 2 cups per day  Lives at home with husband    Social Determinants of Health   Financial Resource Strain: Low Risk    Difficulty of Paying Living Expenses: Not hard at all  Food Insecurity: No Food Insecurity   Worried About Charity fundraiser in the Last Year: Never true   Arboriculturist in the Last Year: Never true  Transportation Needs: No Transportation Needs   Lack of Transportation (Medical): No   Lack of Transportation (Non-Medical): No  Physical Activity: Not on file  Stress: Not on file  Social Connections: Not on file    ALLERGIES: No Known Allergies  MEDICATIONS:  Current Outpatient Medications  Medication Sig Dispense Refill   ALPRAZolam (XANAX) 1 MG tablet Take 1 tablet (1 mg total) by mouth 3 (three) times daily as needed for anxiety. 30 tablet 0   gabapentin (NEURONTIN) 300 MG capsule TAKE 1 CAPSULE BY MOUTH 3  TIMES DAILY 270 capsule 3   linaCLOtide (LINZESS PO) Take by mouth.     simvastatin (ZOCOR) 40 MG tablet Take 1 tablet (40 mg total) by mouth at bedtime. 30 tablet    No current facility-administered medications for this encounter.    REVIEW OF SYSTEMS: A 10+ POINT REVIEW OF SYSTEMS WAS OBTAINED including neurology, dermatology, psychiatry, cardiac, respiratory, lymph, extremities, GI, GU, musculoskeletal, constitutional, reproductive, HEENT. On the provided form, she reports wearing glasses, hearing aids, sleeping with two pillows, history of skin cancer one year ago, back pain, arthritis, and anxiety. She denies any other symptoms.  Prior to biopsy she denied any pain within the breast area nipple discharge or bleeding   PHYSICAL EXAM:   Vitals with BMI 01/22/2021  Height 5' 9"   Weight 214 lbs 5 oz  BMI 16.10  Systolic 960  Diastolic 72  Pulse 70   Lungs are clear to auscultation bilaterally. Heart has regular rate and rhythm. No palpable cervical, supraclavicular, or axillary adenopathy.  Abdomen soft, non-tender, normal bowel sounds. Breast: Left breast with long scar in medial aspect from prior benign lumpectomy. Right breast with mild bruising in medial breast, no palpable mass in this area. Biopsy site in UOQ of right breast; no palpable mass nipple discharge or bleeding.  KPS = 100  100 - Normal; no complaints; no evidence of disease. 90   - Able to carry on normal activity; minor signs or symptoms of disease. 80   - Normal activity with effort; some signs or symptoms of disease. 56   - Cares for self; unable to carry on normal activity or to do active work. 60   - Requires occasional assistance, but is able to care for most of his personal needs. 50   - Requires considerable assistance and frequent medical care. 20   - Disabled; requires special care and assistance. 59   - Severely disabled; hospital admission is indicated although death not imminent. 20   -  Very sick; hospital admission necessary; active supportive treatment necessary. 10   - Moribund; fatal processes progressing rapidly. 0     - Dead  Karnofsky DA, Abelmann [place], Craver LS and Burchenal [facility] [phone]) The use of the nitrogen mustards in the palliative treatment of carcinoma: with particular reference to bronchogenic carcinoma Cancer 1 [id number]  LABORATORY DATA:  Lab Results  Component Value Date   WBC 6.3 01/22/2021   HGB 12.7 01/22/2021   HCT 39.2 01/22/2021   MCV 98.5 01/22/2021   PLT 151 01/22/2021   Lab Results  Component Value Date   NA 142 01/22/2021   K 4.4 01/22/2021   CL 109 01/22/2021   CO2 22 01/22/2021   Lab Results  Component Value Date   ALT 29 01/22/2021   AST 25 01/22/2021   ALKPHOS 99 01/22/2021   BILITOT 0.5 01/22/2021    PULMONARY FUNCTION TEST:   Recent Review Flowsheet Data   There is no flowsheet data to display.     RADIOGRAPHY: US BREAST LTD UNI RIGHT INC AXILLA  Result Date: 01/08/2021 CLINICAL DATA:  76 year old female for further evaluation of possible RIGHT  breast mass with distortion and calcifications, possible focal RIGHT breast asymmetry and LEFT breast calcifications identified on screening study. EXAM: DIGITAL DIAGNOSTIC BILATERAL MAMMOGRAM WITH TOMOSYNTHESIS AND CAD; ULTRASOUND RIGHT BREAST LIMITED TECHNIQUE: Bilateral digital diagnostic mammography and breast tomosynthesis was performed. The images were evaluated with computer-aided detection.; Targeted ultrasound examination of the right breast was performed COMPARISON:  Previous exam(s). ACR Breast Density Category c: The breast tissue is heterogeneously dense, which may obscure small masses. FINDINGS: Full field and magnification views of both breasts and spot compression views of the RIGHT breast are performed. A persistent irregular mass within the posterior UPPER RIGHT breast identified with associated pleomorphic calcifications. A persistent lobulated mass within the [facility] RIGHT breast is noted. Scattered benign appearing calcifications within the UPPER-OUTER LEFT breast are not significantly changed from 2016, and considered benign. Targeted ultrasound is performed, showing the following: A 2.1 x 1.8 x 2.6 cm irregular hypoechoic mass within the posterior RIGHT breast at the 11 o'clock position 8 cm from the nipple. A 3 x 0.8 x 1.5 cm area of cystic changes with some complexity identified at the 2:30 position of the RIGHT breast 6 cm from the nipple, accounting for the other screening study finding. No abnormal RIGHT axillary lymph nodes are identified. IMPRESSION: 1. Highly suspicious 2.6 cm posterior UPPER-OUTER RIGHT breast mass. Tissue sampling is recommended. No abnormal RIGHT axillary lymph nodes. 2. More likely benign complex cystic changes within the [facility] RIGHT breast. Although six-month follow-up can be performed, given highly suspicious finding above, tissue sampling is recommended. 3. Benign LEFT breast calcifications. RECOMMENDATION: Ultrasound-guided biopsies of the 2.6 cm UPPER-OUTER  RIGHT breast mass and 3 cm area of INNER RIGHT breast complex cystic changes. These biopsies will be scheduled. I have discussed the findings and recommendations with the patient. If applicable, a reminder letter will be sent to the patient regarding the next appointment. BI-RADS CATEGORY  5: Highly suggestive of malignancy. Electronically Signed   By: Margarette Canada M.D.   On: 01/08/2021 11:10  MM DIAG BREAST TOMO BILATERAL  Result Date: 01/08/2021 CLINICAL DATA:  75 year old female for further evaluation of possible RIGHT breast mass with distortion and calcifications, possible focal RIGHT breast asymmetry and LEFT breast calcifications identified on screening study. EXAM: DIGITAL DIAGNOSTIC BILATERAL MAMMOGRAM WITH TOMOSYNTHESIS AND CAD; ULTRASOUND RIGHT BREAST LIMITED TECHNIQUE: Bilateral digital diagnostic mammography and  breast tomosynthesis was performed. The images were evaluated with computer-aided detection.; Targeted ultrasound examination of the right breast was performed COMPARISON:  Previous exam(s). ACR Breast Density Category c: The breast tissue is heterogeneously dense, which may obscure small masses. FINDINGS: Full field and magnification views of both breasts and spot compression views of the RIGHT breast are performed. A persistent irregular mass within the posterior UPPER RIGHT breast identified with associated pleomorphic calcifications. A persistent lobulated mass within the [facility] RIGHT breast is noted. Scattered benign appearing calcifications within the UPPER-OUTER LEFT breast are not significantly changed from 2016, and considered benign. Targeted ultrasound is performed, showing the following: A 2.1 x 1.8 x 2.6 cm irregular hypoechoic mass within the posterior RIGHT breast at the 11 o'clock position 8 cm from the nipple. A 3 x 0.8 x 1.5 cm area of cystic changes with some complexity identified at the 2:30 position of the RIGHT breast 6 cm from the nipple, accounting for the other  screening study finding. No abnormal RIGHT axillary lymph nodes are identified. IMPRESSION: 1. Highly suspicious 2.6 cm posterior UPPER-OUTER RIGHT breast mass. Tissue sampling is recommended. No abnormal RIGHT axillary lymph nodes. 2. More likely benign complex cystic changes within the [facility] RIGHT breast. Although six-month follow-up can be performed, given highly suspicious finding above, tissue sampling is recommended. 3. Benign LEFT breast calcifications. RECOMMENDATION: Ultrasound-guided biopsies of the 2.6 cm UPPER-OUTER RIGHT breast mass and 3 cm area of INNER RIGHT breast complex cystic changes. These biopsies will be scheduled. I have discussed the findings and recommendations with the patient. If applicable, a reminder letter will be sent to the patient regarding the next appointment. BI-RADS CATEGORY  5: Highly suggestive of malignancy. Electronically Signed   By: Margarette Canada M.D.   On: 01/08/2021 11:10  MM CLIP PLACEMENT RIGHT  Result Date: 01/13/2021 CLINICAL DATA:  Evaluate biopsy markers EXAM: 3D DIAGNOSTIC RIGHT MAMMOGRAM POST ULTRASOUND BIOPSY COMPARISON:  Previous exam(s). FINDINGS: 3D Mammographic images were obtained following ultrasound guided biopsy of right breast masses at 11 o'clock and 230. The coil shaped clip is along the anterior aspect of the 11 o'clock right breast mass. The mammographic and sonographic abnormalities correlate at 11 o'clock. The ribbon shaped clip correlates with the 230 right breast mass. IMPRESSION: The coil shaped clip is in the anterior aspect of the 11 o'clock right breast mass, in good position. The ribbon shaped clip correlates with the 230 right breast mass. Final Assessment: Post Procedure Mammograms for Marker Placement Electronically Signed   By: Dorise Bullion III M.D   On: 01/13/2021 15:00  [place] RT BREAST BX W LOC DEV 1ST LESION IMG BX SPEC US GUIDE  Addendum Date: 01/15/2021   ADDENDUM REPORT: 01/14/2021 12:53 ADDENDUM: Pathology revealed GRADE 2/3  INVASIVE DUCTAL CARCINOMA, DUCTAL CARCINOMA IN SITU WITH NECROSIS of the RIGHT breast, 11 o'clock, 8cmfn, (coil clip). This was found to be concordant by Dr. Dorise Bullion. Pathology revealed FIBROCYSTIC CHANGE WITH APOCRINE METAPLASIA- NO MALIGNANCY IDENTIFIED of the RIGHT breast, 2:30 o'clock, (ribbon clip). This was found to be concordant by Dr. Dorise Bullion. Pathology results were discussed with the patient by telephone. The patient reported doing well after the biopsies with tenderness at the sites. Post biopsy instructions and care were reviewed and questions were answered. The patient was encouraged to call [facility] for any additional concerns. The patient was referred to [facility] on January 22, 2021. Pathology  results reported by Stacie Acres RN on 01/14/2021. Electronically Signed   By: Dorise Bullion III M.D   On: 01/14/2021 12:53   Result Date: 01/15/2021 CLINICAL DATA:  Biopsy of a hypoechoic mass in the right breast at 11 o'clock. Biopsy of probable cystic changes at 2:30 in the right breast. EXAM: ULTRASOUND GUIDED RIGHT BREAST CORE NEEDLE BIOPSY COMPARISON:  Previous exam(s). PROCEDURE: I met with the patient and we discussed the procedure of ultrasound-guided biopsy, including benefits and alternatives. We discussed the high likelihood of a successful procedure. We discussed the risks of the procedure, including infection, bleeding, tissue injury, clip migration, and inadequate sampling. Informed written consent was given. The usual time-out protocol was performed immediately prior to the procedure. Lesion quadrant: Right breast, 11 o'clock, 8 cm from the nipple Using sterile technique and 1% Lidocaine as local anesthetic, under direct ultrasound visualization, a 12 gauge spring-loaded device was used to perform biopsy of the right breast mass at 11 o'clock using a lateral approach. At the  conclusion of the procedure a coil shaped tissue marker clip was deployed into the biopsy cavity. Follow up 2 view mammogram was performed and dictated separately. Lesion quadrant: Right breast, 230 Using sterile technique and 1% Lidocaine as local anesthetic, under direct ultrasound visualization, a 12 gauge spring-loaded device was used to perform biopsy of the 230 right breast mass using a medial approach. At the conclusion of the procedure a ribbon shaped tissue marker clip was deployed into the biopsy cavity. Follow up 2 view mammogram was performed and dictated separately. IMPRESSION: Ultrasound guided biopsy of 2 right breast masses. No apparent complications. Electronically Signed: By: Dorise Bullion III M.D On: 01/13/2021 14:57  [place] RT BREAST BX W LOC DEV EA ADD LESION IMG BX SPEC US GUIDE  Addendum Date: 01/15/2021   ADDENDUM REPORT: 01/14/2021 12:53 ADDENDUM: Pathology revealed GRADE 2/3 INVASIVE DUCTAL CARCINOMA, DUCTAL CARCINOMA IN SITU WITH NECROSIS of the RIGHT breast, 11 o'clock, 8cmfn, (coil clip). This was found to be concordant by Dr. Dorise Bullion. Pathology revealed FIBROCYSTIC CHANGE WITH APOCRINE METAPLASIA- NO MALIGNANCY IDENTIFIED of the RIGHT breast, 2:30 o'clock, (ribbon clip). This was found to be concordant by Dr. Dorise Bullion. Pathology results were discussed with the patient by telephone. The patient reported doing well after the biopsies with tenderness at the sites. Post biopsy instructions and care were reviewed and questions were answered. The patient was encouraged to call [facility] for any additional concerns. The patient was referred to [facility] on January 22, 2021. Pathology results reported by Stacie Acres RN on 01/14/2021. Electronically Signed   By: Dorise Bullion III M.D   On: 01/14/2021 12:53   Result Date: 01/15/2021 CLINICAL DATA:  Biopsy of a hypoechoic  mass in the right breast at 11 o'clock. Biopsy of probable cystic changes at 2:30 in the right breast. EXAM: ULTRASOUND GUIDED RIGHT BREAST CORE NEEDLE BIOPSY COMPARISON:  Previous exam(s). PROCEDURE: I met with the patient and we discussed the procedure of ultrasound-guided biopsy, including benefits and alternatives. We discussed the high likelihood of a successful procedure. We discussed the risks of the procedure, including infection, bleeding, tissue injury, clip migration, and inadequate sampling. Informed written consent was given. The usual time-out protocol was performed immediately prior to the procedure. Lesion quadrant: Right breast, 11 o'clock, 8 cm from the nipple Using sterile technique and 1% Lidocaine as local anesthetic, under direct ultrasound visualization, a  12 gauge spring-loaded device was used to perform biopsy of the right breast mass at 11 o'clock using a lateral approach. At the conclusion of the procedure a coil shaped tissue marker clip was deployed into the biopsy cavity. Follow up 2 view mammogram was performed and dictated separately. Lesion quadrant: Right breast, 230 Using sterile technique and 1% Lidocaine as local anesthetic, under direct ultrasound visualization, a 12 gauge spring-loaded device was used to perform biopsy of the 230 right breast mass using a medial approach. At the conclusion of the procedure a ribbon shaped tissue marker clip was deployed into the biopsy cavity. Follow up 2 view mammogram was performed and dictated separately. IMPRESSION: Ultrasound guided biopsy of 2 right breast masses. No apparent complications. Electronically Signed: By: Dorise Bullion III M.D On: 01/13/2021 14:57     IMPRESSION: Stage IB (cT2, cN0, cM0),  Right Breast UOQ, Invasive Ductal Carcinoma with DCIS, ER+ / PR+ / Her2-, Grade 2   Patient will be a good candidate for breast conservation with radiotherapy to right breast. We discussed the general course of radiation, potential  side effects, and toxicities with radiation and the patient is interested in this approach.   Prior to proceeding with lumpectomy and with sentinel node procedure, she will proceed with MRI as requested Dr. Ninfa Linden.   PLAN:  MRI Lumpectomy and sentinel node procedure Oncotype Radiation depending on final pathology and size of tumor  Aromatase Inhibitor   ------------------------------------------------  Blair Promise, PhD, MD  This document serves as a record of services personally performed by Gery Pray, MD. It was created on his behalf by Roney Mans, a trained medical scribe. The creation of this record is based on the scribe's personal observations and the provider's statements to them. This document has been checked and approved by the attending provider.

## 2021-01-22 ENCOUNTER — Inpatient Hospital Stay: Payer: Medicare Other | Attending: Oncology | Admitting: Oncology

## 2021-01-22 ENCOUNTER — Other Ambulatory Visit: Payer: Self-pay

## 2021-01-22 ENCOUNTER — Encounter: Payer: Self-pay | Admitting: Oncology

## 2021-01-22 ENCOUNTER — Encounter: Payer: Self-pay | Admitting: Physical Therapy

## 2021-01-22 ENCOUNTER — Ambulatory Visit
Admission: RE | Admit: 2021-01-22 | Discharge: 2021-01-22 | Disposition: A | Discharge status: Home / Self Care | Payer: Medicare Other | Source: Ambulatory Visit | Attending: Radiation Oncology | Admitting: Radiation Oncology

## 2021-01-22 ENCOUNTER — Inpatient Hospital Stay: Payer: Medicare Other

## 2021-01-22 ENCOUNTER — Inpatient Hospital Stay: Payer: Medicare Other | Admitting: Licensed Clinical Social Worker

## 2021-01-22 ENCOUNTER — Other Ambulatory Visit: Payer: Self-pay | Admitting: Surgery

## 2021-01-22 ENCOUNTER — Ambulatory Visit: Payer: Medicare Other | Admitting: Physical Therapy

## 2021-01-22 VITALS — BP 162/72 | HR 70 | Temp 97.9°F | Resp 19 | Ht 69.0 in | Wt 214.3 lb

## 2021-01-22 DIAGNOSIS — Z853 Personal history of malignant neoplasm of breast: Secondary | ICD-10-CM

## 2021-01-22 DIAGNOSIS — Z170 Estrogen receptor positive status [ER+]: Secondary | ICD-10-CM

## 2021-01-22 DIAGNOSIS — C50411 Malignant neoplasm of upper-outer quadrant of right female breast: Secondary | ICD-10-CM

## 2021-01-22 DIAGNOSIS — Z85828 Personal history of other malignant neoplasm of skin: Secondary | ICD-10-CM | POA: Insufficient documentation

## 2021-01-22 DIAGNOSIS — R293 Abnormal posture: Secondary | ICD-10-CM

## 2021-01-22 LAB — CBC WITH DIFFERENTIAL (CANCER CENTER ONLY)
Abs Immature Granulocytes: 0.03 10*3/uL (ref 0.00–0.07)
Basophils Absolute: 0.1 10*3/uL (ref 0.0–0.1)
Basophils Relative: 1 %
Eosinophils Absolute: 0.2 10*3/uL (ref 0.0–0.5)
Eosinophils Relative: 4 %
HCT: 39.2 % (ref 36.0–46.0)
Hemoglobin: 12.7 g/dL (ref 12.0–15.0)
Immature Granulocytes: 1 %
Lymphocytes Relative: 34 %
Lymphs Abs: 2.2 10*3/uL (ref 0.7–4.0)
MCH: 31.9 pg (ref 26.0–34.0)
MCHC: 32.4 g/dL (ref 30.0–36.0)
MCV: 98.5 fL (ref 80.0–100.0)
Monocytes Absolute: 0.6 10*3/uL (ref 0.1–1.0)
Monocytes Relative: 9 %
Neutro Abs: 3.2 10*3/uL (ref 1.7–7.7)
Neutrophils Relative %: 51 %
Platelet Count: 151 10*3/uL (ref 150–400)
RBC: 3.98 MIL/uL (ref 3.87–5.11)
RDW: 13.6 % (ref 11.5–15.5)
WBC Count: 6.3 10*3/uL (ref 4.0–10.5)
nRBC: 0 % (ref 0.0–0.2)

## 2021-01-22 LAB — CMP (CANCER CENTER ONLY)
ALT: 29 U/L (ref 0–44)
AST: 25 U/L (ref 15–41)
Albumin: 3.9 g/dL (ref 3.5–5.0)
Alkaline Phosphatase: 99 U/L (ref 38–126)
Anion gap: 11 (ref 5–15)
BUN: 24 mg/dL — ABNORMAL HIGH (ref 8–23)
CO2: 22 mmol/L (ref 22–32)
Calcium: 9 mg/dL (ref 8.9–10.3)
Chloride: 109 mmol/L (ref 98–111)
Creatinine: 1.25 mg/dL — ABNORMAL HIGH (ref 0.44–1.00)
GFR, Estimated: 44 mL/min — ABNORMAL LOW (ref >60)
Glucose, Bld: 160 mg/dL — ABNORMAL HIGH (ref 70–99)
Potassium: 4.4 mmol/L (ref 3.5–5.1)
Sodium: 142 mmol/L (ref 135–145)
Total Bilirubin: 0.5 mg/dL (ref 0.3–1.2)
Total Protein: 6.5 g/dL (ref 6.5–8.1)

## 2021-01-22 LAB — GENETIC SCREENING ORDER

## 2021-01-22 NOTE — Progress Notes (Signed)
[place]  Initial Assessment   Chelsea Lee is a 75 y.o. year old female accompanied by daughter and husband. Clinical Social Work was referred by [facility] for assessment of psychosocial needs.   SDOH (Social Determinants of Health) assessments performed: Yes SDOH Interventions    Flowsheet Row Most Recent Value  SDOH Interventions   Food Insecurity Interventions Intervention Not Indicated  Financial Strain Interventions Intervention Not Indicated  Housing Interventions Intervention Not Indicated  Transportation Interventions Intervention Not Indicated       Distress Screen completed: Yes ONCBCN DISTRESS SCREENING 01/22/2021  Screening Type Initial Screening  Distress experienced in past week (1-10) 7  Emotional problem type Nervousness/Anxiety;Adjusting to illness      Family/Social Information:  Housing Arrangement: patient lives with husband, [name].  Adult children ([place]) live in [place] members/support persons in your life? Family Transportation concerns: no  Employment: Retired. Income source: Paediatric nurse concerns: No Type of concern: None Food access concerns: no Services Currently in place:  n/a  Coping/ Adjustment to diagnosis: Patient understands treatment plan and what happens next? yes Concerns about diagnosis and/or treatment:  General adjustment to diagnosis and treatment plan Patient reported stressors: Adjusting to my illness Patient enjoys time with family/ friends and water aerobics, travel Current coping skills/ strengths: Motivation for treatment/growth, Special hobby/interest, and Supportive family/friends    SUMMARY: Current SDOH Barriers:  No significant SDOH barriers noted today   Interventions: Discussed common feeling and emotions when being diagnosed with cancer, and the importance of support during treatment Informed patient of the support team roles and support services at  [facility] Provided CSW contact information and encouraged patient to call with any questions or concerns Referred patient to [place] guide/ peer mentor   Follow Up Plan: Patient will contact CSW with any support or resource needs Patient verbalizes understanding of plan: Yes    Christeen Douglas LCSW

## 2021-01-22 NOTE — Therapy (Signed)
[place], [place], [address] Phone: [phone]   Fax:  [phone]  Physical Therapy Evaluation  Patient Details  Name: GAYNELL EGGLETON MRN: [id number] Date of Birth: [birth year] Referring Provider (PT): Dr. Coralie Keens   Encounter Date: 01/22/2021   PT End of Session - 01/22/21 1112     Visit Number 1    Number of Visits 2    Date for PT Re-Evaluation 03/19/21    PT Start Time 0933    PT Stop Time 0947   Also saw pt from 1012-1035 for a total of 37 min   PT Time Calculation (min) 14 min    Activity Tolerance Patient tolerated treatment well    Behavior During Therapy [facility] for tasks assessed/performed             Past Medical History:  Diagnosis Date   Anxiety    Breast cancer ([place])    Cervical radiculopathy    CKD (chronic kidney disease)    stage 3   Hearing loss    HLD (hyperlipidemia)    Obesity    Osteoarthritis     Past Surgical History:  Procedure Laterality Date   BREAST BIOPSY Left    MENISCUS REPAIR Right 2008    There were no vitals filed for this visit.    Subjective Assessment - 01/22/21 0905     Subjective Patient reports she is here today to be seen by her medical team for her newly diagnosed right breast cancer.    Patient is accompained by: Family member    Pertinent History Patient was diagnosed on 11/11/2020 with right grade II-III invasive ductal carcinoma breast cancer. It measures 2.6 cm and is located in the upper outer quadrant. It is ER/PR positive and HER2 negative with a Ki67 of 15%. She had a right foot fusion in 2019 due to osteoarthritis.    Patient Stated Goals Reduce lymphedema risk and learn post op shoulder ROM HEP    Currently in Pain? Yes    Pain Score 9     Pain Location Foot    Pain Orientation Right    Pain Descriptors / Indicators Aching    Pain Type Chronic pain    Pain Onset More than a month ago    Pain Frequency Intermittent     Aggravating Factors  Walking    Pain Relieving Factors Rest/elevation    Multiple Pain Sites No                [facility] PT Assessment - 01/22/21 0001       Assessment   Medical Diagnosis Right breast cancer    Referring Provider (PT) Dr. Coralie Keens    Onset Date/Surgical Date 11/11/20    Hand Dominance Right    Prior Therapy none      Precautions   Precautions Other (comment)    Precaution Comments active cancer      Restrictions   Weight Bearing Restrictions No      Balance Screen   Has the patient fallen in the past 6 months No    Has the patient had a decrease in activity level because of a fear of falling?  No    Is the patient reluctant to leave their home because of a fear of falling?  No      Home Environment   Living Environment Private residence    Living Arrangements Spouse/significant other    Available Help at Discharge Family  Prior Function   Level of Independence Independent    Vocation Retired    Leisure She does water aerobics 2x/week and a walks a few days per week but is limited by her right foot pain      Cognition   Overall Cognitive Status Within Functional Limits for tasks assessed      Posture/Postural Control   Posture/Postural Control Postural limitations    Postural Limitations Rounded Shoulders;Forward head      ROM / Strength   AROM / PROM / Strength AROM;Strength      AROM   Overall AROM Comments Cervical AROM is WNL    AROM Assessment Site Shoulder    Right/Left Shoulder Right;Left    Right Shoulder Extension 38 Degrees    Right Shoulder Flexion 151 Degrees    Right Shoulder ABduction 161 Degrees    Right Shoulder Internal Rotation 58 Degrees    Right Shoulder External Rotation 77 Degrees    Left Shoulder Extension 43 Degrees    Left Shoulder Flexion 145 Degrees    Left Shoulder ABduction 158 Degrees    Left Shoulder Internal Rotation 77 Degrees    Left Shoulder External Rotation 78 Degrees      Strength    Overall Strength Within functional limits for tasks performed               LYMPHEDEMA/ONCOLOGY QUESTIONNAIRE - 01/22/21 0001       Type   Cancer Type Right breast cancer      Lymphedema Assessments   Lymphedema Assessments Upper extremities      Right Upper Extremity Lymphedema   10 cm Proximal to Olecranon Process 31.8 cm    Olecranon Process 26.8 cm    10 cm Proximal to Ulnar Styloid Process 22.7 cm    Just Proximal to Ulnar Styloid Process 16 cm    Across Hand at [employer] 19.8 cm    At [place] of 2nd Digit 6.8 cm      Left Upper Extremity Lymphedema   10 cm Proximal to Olecranon Process 32.3 cm    Olecranon Process 27.4 cm    10 cm Proximal to Ulnar Styloid Process 21.9 cm    Just Proximal to Ulnar Styloid Process 16.2 cm    Across Hand at [employer] 20.5 cm    At [place] of 2nd Digit 7 cm             L-DEX FLOWSHEETS - 01/22/21 1100       L-DEX LYMPHEDEMA SCREENING   Measurement Type Unilateral    L-DEX MEASUREMENT EXTREMITY Upper Extremity    POSITION  Standing    DOMINANT SIDE Right    At Risk Side Right    BASELINE SCORE (UNILATERAL) -6.7             The patient was assessed using the L-Dex machine today to produce a lymphedema index baseline score. The patient will be reassessed on a regular basis (typically every 3 months) to obtain new L-Dex scores. If the score is > 6.5 points away from his/her baseline score indicating onset of subclinical lymphedema, it will be recommended to wear a compression garment for 4 weeks, 12 hours per day and then be reassessed. If the score continues to be > 6.5 points from baseline at reassessment, we will initiate lymphedema treatment. Assessing in this manner has a 95% rate of preventing clinically significant lymphedema.      Katina Dung - 01/22/21 0001  Open a tight or new jar Mild difficulty    Do heavy household chores (wash walls, wash floors) Mild difficulty    Carry a shopping bag or briefcase  No difficulty    Wash your back No difficulty    Use a knife to cut food No difficulty    Recreational activities in which you take some force or impact through your arm, shoulder, or hand (golf, hammering, tennis) No difficulty    During the past week, to what extent has your arm, shoulder or hand problem interfered with your normal social activities with family, friends, neighbors, or groups? Not at all    During the past week, to what extent has your arm, shoulder or hand problem limited your work or other regular daily activities Not at all    Arm, shoulder, or hand pain. None    Tingling (pins and needles) in your arm, shoulder, or hand None    Difficulty Sleeping No difficulty    DASH Score 4.55 %              Objective measurements completed on examination: See above findings.      Patient was instructed today in a home exercise program today for post op shoulder range of motion. These included active assist shoulder flexion in sitting, scapular retraction, wall walking with shoulder abduction, and hands behind head external rotation.  She was encouraged to do these twice a day, holding 3 seconds and repeating 5 times when permitted by her physician.           PT Education - 01/22/21 1111     Education Details Lymphedema risk reduction and post op shoulder ROM HEP    Person(s) Educated Patient;Spouse;Child(ren)    Methods Explanation;Demonstration;Handout    Comprehension Returned demonstration;Verbalized understanding                 PT Long Term Goals - 01/22/21 1114       PT LONG TERM GOAL #1   Title Patient will demonstrate she has regained full shoulder ROM and function post operatively compared to baselines.    Time 8    Period Weeks    Status New    Target Date 03/19/21             Breast Clinic Goals - 01/22/21 1114       Patient will be able to verbalize understanding of pertinent lymphedema risk reduction practices relevant to her  diagnosis specifically related to skin care.   Time 1    Period Days    Status Achieved      Patient will be able to return demonstrate and/or verbalize understanding of the post-op home exercise program related to regaining shoulder range of motion.   Time 1    Period Days    Status Achieved      Patient will be able to verbalize understanding of the importance of attending the postoperative After Breast Cancer Class for further lymphedema risk reduction education and therapeutic exercise.   Time 1    Period Days    Status Achieved                   Plan - 01/22/21 1112     Clinical Impression Statement Patient was diagnosed on 11/11/2020 with right grade II-III invasive ductal carcinoma breast cancer. It measures 2.6 cm and is located in the upper outer quadrant. It is ER/PR positive and HER2 negative with a Ki67 of 15%. She had a  right foot fusion in 2019 due to osteoarthritis. Her multidisciplinary medical team met prior to her assessments to determine a recommended treatment plan. She is planning to have a right lumpectomy and sentinel node biopsy followed by Oncotype testing, radiation, and anti-estrogen therapy. She will benefit from a post op PT reassessment to determine needs and from L-Dex screens every 3 months for 2 years to detect subclinical lymphedema.    Stability/Clinical Decision Making Stable/Uncomplicated    Clinical Decision Making Low    Rehab Potential Excellent    PT Frequency --   Eval and 1 f/u visit   PT Treatment/Interventions ADLs/Self Care Home Management;Therapeutic exercise;Patient/family education    PT Next Visit Plan Will reassess 3-4 weeks post op    PT Home Exercise Plan Post op shoulder ROM HEP    Consulted and Agree with Plan of Care Patient;Family member/caregiver    Family Member Consulted Husband and daughter from [place]             Patient will benefit from skilled therapeutic intervention in order to improve the following  deficits and impairments:  Postural dysfunction, Decreased range of motion, Impaired UE functional use, Pain, Decreased knowledge of precautions  Visit Diagnosis: Malignant neoplasm of upper-outer quadrant of right breast in female, estrogen receptor positive ([place]) - Plan: PT plan of care cert/re-cert  Abnormal posture - Plan: PT plan of care cert/re-cert  Patient will follow up at outpatient cancer rehab 3-4 weeks following surgery.  If the patient requires physical therapy at that time, a specific plan will be dictated and sent to the referring physician for approval. The patient was educated today on appropriate basic range of motion exercises to begin post operatively and the importance of attending the After Breast Cancer class following surgery.  Patient was educated today on lymphedema risk reduction practices as it pertains to recommendations that will benefit the patient immediately following surgery.  She verbalized good understanding.      Problem List Patient Active Problem List   Diagnosis Date Noted   Malignant neoplasm of upper-outer quadrant of right breast in female, estrogen receptor positive ([place]) 01/20/2021   Family history of Alzheimer's disease 02/21/2019   Primary osteoarthritis of right foot 03/09/2017   Annia Friendly, PT 01/22/21 11:18 AM   [place], [place], [address] Phone: [phone]   Fax:  [phone]  Name: ANAHY ESH MRN: [id number] Date of Birth: 1946/02/01

## 2021-01-22 NOTE — Patient Instructions (Signed)

## 2021-01-22 NOTE — Progress Notes (Signed)
[place]  [phone] [phone]     ID: CARRISSA TAITANO DOB: [birth year]  MR#: [id number]  EFE#:[id number]  Patient Care Team: Velna Hatchet, MD as PCP - General (Internal Medicine) Crist Infante, MD as Consulting Physician (Internal Medicine) Mauro Kaufmann, RN as Oncology Nurse Navigator Rockwell Germany, RN as Oncology Nurse Navigator Coralie Keens, MD as Consulting Physician (General Surgery) Errol Ala, Virgie Dad, MD as Consulting Physician (Oncology) Gery Pray, MD as Consulting Physician (Radiation Oncology) Harriett Sine, MD as Consulting Physician (Dermatology) Melrose Nakayama, MD as Consulting Physician (Orthopedic Surgery) Tyler Pita, MD as Referring Physician (Otolaryngology) Chauncey Cruel, MD OTHER MD:  CHIEF COMPLAINT: Estrogen receptor positive breast cancer  CURRENT TREATMENT: Awaiting definitive surgery   HISTORY OF CURRENT ILLNESS: ASMA BOLDON had routine screening mammography on 11/11/2020 showing a possible abnormality in the bilateral breasts. She underwent bilateral diagnostic mammography with tomography and right breast ultrasonography at [facility] on 01/08/2021 showing: breast density category C; 2.6 cm posterior upper-outer right breast mass; no abnormal right axillary lymph nodes; more likely benign complex cystic changes within inner right breast; benign left breast calcifications.  Accordingly on 01/13/2021 she proceeded to biopsy of the right breast area in question. The pathology from the 11 o'clock mass ([id number]) showed: invasive ductal carcinoma, grade 2-3; ductal carcinoma in situ with necrosis. Prognostic indicators significant for: estrogen receptor, >95% positive and progesterone receptor, >95% positive, both with strong staining intensity. Proliferation marker Ki67 at 15%. HER2 equivocal by immunohistochemistry (2+), but negative by fluorescent in situ hybridization with a signals  ratio 1.3 and number per cell 1.75.  The area in the right breast at 2:30 was biopsied at the same time showing fibrocystic change with apocrine metaplasia.  Cancer Staging Malignant neoplasm of upper-outer quadrant of right breast in female, estrogen receptor positive ([place]) Staging form: Breast, AJCC 8th Edition - Clinical stage from 01/22/2021: Stage IB (cT2, cN0, cM0, G2, ER+, PR+, HER2-) - Signed by Chauncey Cruel, MD on 01/22/2021 Stage prefix: Initial diagnosis Histologic grading system: 3 grade system  The patient's subsequent history is as detailed below.   INTERVAL HISTORY: Nilza was evaluated in the multidisciplinary breast cancer clinic on 01/22/2021 accompanied by husband [name] and their daughter Anda Kraft. Her case was also presented at the multidisciplinary breast cancer conference on the same day. At that time a preliminary plan was proposed: breast MRI followed by definitive surgery, Oncotype, adjuvant radiation, and antiestrogens   REVIEW OF SYSTEMS: There were no specific symptoms leading to the original mammogram, which was routinely scheduled. On the provided questionnaire, Libbey reports wearing glasses and hearing aids, sleeping with two pillows, history of skin cancer, back pain, arthritis, and anxiety. The patient denies unusual headaches, visual changes, nausea, vomiting, stiff neck, dizziness, or gait imbalance. There has been no cough, phlegm production, or pleurisy, no chest pain or pressure, and no change in bowel or bladder habits. The patient denies fever, rash, bleeding, unexplained fatigue or unexplained weight loss. A detailed review of systems was otherwise entirely negative.   COVID 62 VACCINATION STATUS: Pfizer x4 as of August 2022   PAST MEDICAL HISTORY: Past Medical History:  Diagnosis Date   Anxiety    Breast cancer ([facility])    Cervical radiculopathy    CKD (chronic kidney disease)    stage 3   Hearing loss    HLD (hyperlipidemia)    Obesity     Osteoarthritis   She reports a history of skin  cancer, treated with 19 fractions of radiation therapy while living in [place] (Dr. Juanito Doom)   PAST SURGICAL HISTORY: Past Surgical History:  Procedure Laterality Date   BREAST BIOPSY Left    MENISCUS REPAIR Right 2008    FAMILY HISTORY: Family History  Problem Relation Age of Onset   Diabetes Mother    Dementia Mother    Heart disease Father        age 7 death   Alcohol abuse Father    Her father died at age 1 from MI. Her mother died at age 19. Darlinda has three brothers (and no sisters). There is no family history of cancer to her knowledge.   GYNECOLOGIC HISTORY:  No LMP recorded. Patient is postmenopausal. Menarche: 75 years old Age at first live birth: 75 years old Inez P 2 LMP date unsure Contraceptive: used from age "19 to ?"  (Several years) HRT never used  Hysterectomy? no BSO? 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 Husband [name] is a Clinical biochemist. She lives at home with [name]; they have no pets. Son Liberty, age 6, is self employed and lives in [place], [place]. Daughter [name]," age 79, is a Doctor, hospital for [employer] in [place], [place]. Rokia attends [employer].    ADVANCED DIRECTIVES: In the absence of any documentation to the contrary, the patient's spouse is their HCPOA.    HEALTH MAINTENANCE: Social History   Tobacco Use   Smoking status: Never   Smokeless tobacco: Never  Substance Use Topics   Alcohol use: Yes    Alcohol/week: 0.0 standard drinks    Comment: social   Drug use: No     Colonoscopy: yes, done in [place]  PAP: date unsure  Bone density: date unsure   No Known Allergies  Current Outpatient Medications  Medication Sig Dispense Refill   ALPRAZolam (XANAX) 1 MG tablet Take 1 tablet (1 mg total) by mouth 3 (three) times daily as needed for anxiety. 30 tablet 0   gabapentin (NEURONTIN)  300 MG capsule TAKE 1 CAPSULE BY MOUTH 3  TIMES DAILY 270 capsule 3   linaCLOtide (LINZESS PO) Take by mouth.     simvastatin (ZOCOR) 40 MG tablet Take 1 tablet (40 mg total) by mouth at bedtime. 30 tablet    No current facility-administered medications for this visit.    OBJECTIVE: White woman who appears younger than stated age  56:   01/22/21 0854  BP: (!) 162/72  Pulse: 70  Resp: 19  Temp: 97.9 F (36.6 C)  SpO2: 99%     Body mass index is 31.65 kg/m.   Wt Readings from Last 3 Encounters:  01/22/21 214 lb 4.8 oz (97.2 kg)  02/21/19 206 lb (93.4 kg)  01/24/15 200 lb (90.7 kg)      ECOG FS:1 - Symptomatic but completely ambulatory  Ocular: Sclerae unicteric, pupils round and equal Ear-nose-throat: Wearing a mask Lymphatic: No cervical or supraclavicular adenopathy Lungs no rales or rhonchi Heart regular rate and rhythm Abd soft, obese nontender, positive bowel sounds MSK no focal spinal tenderness, no joint edema Neuro: non-focal, well-oriented, appropriate affect Breasts: The right breast is status post recent biopsy.  I do not palpate a well-defined mass.  The left breast and both axillae are benign   LAB RESULTS:  CMP     Component Value Date/Time   NA 142 01/22/2021 0825   K 4.4 01/22/2021 0825  CL 109 01/22/2021 0825   CO2 22 01/22/2021 0825   GLUCOSE 160 (H) 01/22/2021 0825   BUN 24 (H) 01/22/2021 0825   CREATININE 1.25 (H) 01/22/2021 0825   CALCIUM 9.0 01/22/2021 0825   PROT 6.5 01/22/2021 0825   ALBUMIN 3.9 01/22/2021 0825   AST 25 01/22/2021 0825   ALT 29 01/22/2021 0825   ALKPHOS 99 01/22/2021 0825   BILITOT 0.5 01/22/2021 0825   GFRNONAA 44 (L) 01/22/2021 0825    No results found for: TOTALPROTELP, ALBUMINELP, A1GS, A2GS, BETS, BETA2SER, GAMS, MSPIKE, SPEI  Lab Results  Component Value Date   WBC 6.3 01/22/2021   NEUTROABS 3.2 01/22/2021   HGB 12.7 01/22/2021   HCT 39.2 01/22/2021   MCV 98.5 01/22/2021   PLT 151 01/22/2021     No results found for: LABCA2  No components found for: [id number]  No results for input(s): INR in the last 168 hours.  No results found for: LABCA2  No results found for: RSW546  No results found for: EVO350  No results found for: KXF818  No results found for: CA2729  No components found for: HGQUANT  No results found for: CEA1 / No results found for: CEA1   No results found for: AFPTUMOR  No results found for: CHROMOGRNA  No results found for: KPAFRELGTCHN, LAMBDASER, KAPLAMBRATIO (kappa/lambda light chains)  No results found for: HGBA, HGBA2QUANT, HGBFQUANT, HGBSQUAN (Hemoglobinopathy evaluation)   No results found for: LDH  No results found for: IRON, TIBC, IRONPCTSAT (Iron and TIBC)  No results found for: FERRITIN  Urinalysis No results found for: COLORURINE, APPEARANCEUR, LABSPEC, PHURINE, GLUCOSEU, HGBUR, BILIRUBINUR, KETONESUR, PROTEINUR, UROBILINOGEN, NITRITE, LEUKOCYTESUR   STUDIES: US BREAST LTD UNI RIGHT INC AXILLA  Result Date: 01/08/2021 CLINICAL DATA:  75 year old female for further evaluation of possible RIGHT breast mass with distortion and calcifications, possible focal RIGHT breast asymmetry and LEFT breast calcifications identified on screening study. EXAM: DIGITAL DIAGNOSTIC BILATERAL MAMMOGRAM WITH TOMOSYNTHESIS AND CAD; ULTRASOUND RIGHT BREAST LIMITED TECHNIQUE: Bilateral digital diagnostic mammography and breast tomosynthesis was performed. The images were evaluated with computer-aided detection.; Targeted ultrasound examination of the right breast was performed COMPARISON:  Previous exam(s). ACR Breast Density Category c: The breast tissue is heterogeneously dense, which may obscure small masses. FINDINGS: Full field and magnification views of both breasts and spot compression views of the RIGHT breast are performed. A persistent irregular mass within the posterior UPPER RIGHT breast identified with associated pleomorphic calcifications. A  persistent lobulated mass within the [facility] RIGHT breast is noted. Scattered benign appearing calcifications within the UPPER-OUTER LEFT breast are not significantly changed from 2016, and considered benign. Targeted ultrasound is performed, showing the following: A 2.1 x 1.8 x 2.6 cm irregular hypoechoic mass within the posterior RIGHT breast at the 11 o'clock position 8 cm from the nipple. A 3 x 0.8 x 1.5 cm area of cystic changes with some complexity identified at the 2:30 position of the RIGHT breast 6 cm from the nipple, accounting for the other screening study finding. No abnormal RIGHT axillary lymph nodes are identified. IMPRESSION: 1. Highly suspicious 2.6 cm posterior UPPER-OUTER RIGHT breast mass. Tissue sampling is recommended. No abnormal RIGHT axillary lymph nodes. 2. More likely benign complex cystic changes within the [facility] RIGHT breast. Although six-month follow-up can be performed, given highly suspicious finding above, tissue sampling is recommended. 3. Benign LEFT breast calcifications. RECOMMENDATION: Ultrasound-guided biopsies of the 2.6 cm UPPER-OUTER RIGHT breast mass and 3 cm area of INNER RIGHT breast complex cystic  changes. These biopsies will be scheduled. I have discussed the findings and recommendations with the patient. If applicable, a reminder letter will be sent to the patient regarding the next appointment. BI-RADS CATEGORY  5: Highly suggestive of malignancy. Electronically Signed   By: Margarette Canada M.D.   On: 01/08/2021 11:10  MM DIAG BREAST TOMO BILATERAL  Result Date: 01/08/2021 CLINICAL DATA:  75 year old female for further evaluation of possible RIGHT breast mass with distortion and calcifications, possible focal RIGHT breast asymmetry and LEFT breast calcifications identified on screening study. EXAM: DIGITAL DIAGNOSTIC BILATERAL MAMMOGRAM WITH TOMOSYNTHESIS AND CAD; ULTRASOUND RIGHT BREAST LIMITED TECHNIQUE: Bilateral digital diagnostic mammography and breast tomosynthesis  was performed. The images were evaluated with computer-aided detection.; Targeted ultrasound examination of the right breast was performed COMPARISON:  Previous exam(s). ACR Breast Density Category c: The breast tissue is heterogeneously dense, which may obscure small masses. FINDINGS: Full field and magnification views of both breasts and spot compression views of the RIGHT breast are performed. A persistent irregular mass within the posterior UPPER RIGHT breast identified with associated pleomorphic calcifications. A persistent lobulated mass within the [facility] RIGHT breast is noted. Scattered benign appearing calcifications within the UPPER-OUTER LEFT breast are not significantly changed from 2016, and considered benign. Targeted ultrasound is performed, showing the following: A 2.1 x 1.8 x 2.6 cm irregular hypoechoic mass within the posterior RIGHT breast at the 11 o'clock position 8 cm from the nipple. A 3 x 0.8 x 1.5 cm area of cystic changes with some complexity identified at the 2:30 position of the RIGHT breast 6 cm from the nipple, accounting for the other screening study finding. No abnormal RIGHT axillary lymph nodes are identified. IMPRESSION: 1. Highly suspicious 2.6 cm posterior UPPER-OUTER RIGHT breast mass. Tissue sampling is recommended. No abnormal RIGHT axillary lymph nodes. 2. More likely benign complex cystic changes within the [facility] RIGHT breast. Although six-month follow-up can be performed, given highly suspicious finding above, tissue sampling is recommended. 3. Benign LEFT breast calcifications. RECOMMENDATION: Ultrasound-guided biopsies of the 2.6 cm UPPER-OUTER RIGHT breast mass and 3 cm area of INNER RIGHT breast complex cystic changes. These biopsies will be scheduled. I have discussed the findings and recommendations with the patient. If applicable, a reminder letter will be sent to the patient regarding the next appointment. BI-RADS CATEGORY  5: Highly suggestive of malignancy.  Electronically Signed   By: Margarette Canada M.D.   On: 01/08/2021 11:10  MM CLIP PLACEMENT RIGHT  Result Date: 01/13/2021 CLINICAL DATA:  Evaluate biopsy markers EXAM: 3D DIAGNOSTIC RIGHT MAMMOGRAM POST ULTRASOUND BIOPSY COMPARISON:  Previous exam(s). FINDINGS: 3D Mammographic images were obtained following ultrasound guided biopsy of right breast masses at 11 o'clock and 230. The coil shaped clip is along the anterior aspect of the 11 o'clock right breast mass. The mammographic and sonographic abnormalities correlate at 11 o'clock. The ribbon shaped clip correlates with the 230 right breast mass. IMPRESSION: The coil shaped clip is in the anterior aspect of the 11 o'clock right breast mass, in good position. The ribbon shaped clip correlates with the 230 right breast mass. Final Assessment: Post Procedure Mammograms for Marker Placement Electronically Signed   By: Dorise Bullion III M.D   On: 01/13/2021 15:00  [place] RT BREAST BX W LOC DEV 1ST LESION IMG BX SPEC US GUIDE  Addendum Date: 01/15/2021   ADDENDUM REPORT: 01/14/2021 12:53 ADDENDUM: Pathology revealed GRADE 2/3 INVASIVE DUCTAL CARCINOMA, DUCTAL CARCINOMA IN SITU WITH NECROSIS of the RIGHT breast, 11 o'clock,  8cmfn, (coil clip). This was found to be concordant by Dr. Dorise Bullion. Pathology revealed FIBROCYSTIC CHANGE WITH APOCRINE METAPLASIA- NO MALIGNANCY IDENTIFIED of the RIGHT breast, 2:30 o'clock, (ribbon clip). This was found to be concordant by Dr. Dorise Bullion. Pathology results were discussed with the patient by telephone. The patient reported doing well after the biopsies with tenderness at the sites. Post biopsy instructions and care were reviewed and questions were answered. The patient was encouraged to call [facility] for any additional concerns. The patient was referred to [facility] on January 22, 2021. Pathology results reported by  Stacie Acres RN on 01/14/2021. Electronically Signed   By: Dorise Bullion III M.D   On: 01/14/2021 12:53   Result Date: 01/15/2021 CLINICAL DATA:  Biopsy of a hypoechoic mass in the right breast at 11 o'clock. Biopsy of probable cystic changes at 2:30 in the right breast. EXAM: ULTRASOUND GUIDED RIGHT BREAST CORE NEEDLE BIOPSY COMPARISON:  Previous exam(s). PROCEDURE: I met with the patient and we discussed the procedure of ultrasound-guided biopsy, including benefits and alternatives. We discussed the high likelihood of a successful procedure. We discussed the risks of the procedure, including infection, bleeding, tissue injury, clip migration, and inadequate sampling. Informed written consent was given. The usual time-out protocol was performed immediately prior to the procedure. Lesion quadrant: Right breast, 11 o'clock, 8 cm from the nipple Using sterile technique and 1% Lidocaine as local anesthetic, under direct ultrasound visualization, a 12 gauge spring-loaded device was used to perform biopsy of the right breast mass at 11 o'clock using a lateral approach. At the conclusion of the procedure a coil shaped tissue marker clip was deployed into the biopsy cavity. Follow up 2 view mammogram was performed and dictated separately. Lesion quadrant: Right breast, 230 Using sterile technique and 1% Lidocaine as local anesthetic, under direct ultrasound visualization, a 12 gauge spring-loaded device was used to perform biopsy of the 230 right breast mass using a medial approach. At the conclusion of the procedure a ribbon shaped tissue marker clip was deployed into the biopsy cavity. Follow up 2 view mammogram was performed and dictated separately. IMPRESSION: Ultrasound guided biopsy of 2 right breast masses. No apparent complications. Electronically Signed: By: Dorise Bullion III M.D On: 01/13/2021 14:57  [place] RT BREAST BX W LOC DEV EA ADD LESION IMG BX SPEC US GUIDE  Addendum Date: 01/15/2021   ADDENDUM REPORT:  01/14/2021 12:53 ADDENDUM: Pathology revealed GRADE 2/3 INVASIVE DUCTAL CARCINOMA, DUCTAL CARCINOMA IN SITU WITH NECROSIS of the RIGHT breast, 11 o'clock, 8cmfn, (coil clip). This was found to be concordant by Dr. Dorise Bullion. Pathology revealed FIBROCYSTIC CHANGE WITH APOCRINE METAPLASIA- NO MALIGNANCY IDENTIFIED of the RIGHT breast, 2:30 o'clock, (ribbon clip). This was found to be concordant by Dr. Dorise Bullion. Pathology results were discussed with the patient by telephone. The patient reported doing well after the biopsies with tenderness at the sites. Post biopsy instructions and care were reviewed and questions were answered. The patient was encouraged to call [facility] for any additional concerns. The patient was referred to [facility] on January 22, 2021. Pathology results reported by Stacie Acres RN on 01/14/2021. Electronically Signed   By: Dorise Bullion III M.D   On: 01/14/2021 12:53   Result Date: 01/15/2021 CLINICAL DATA:  Biopsy of a hypoechoic mass in the right  breast at 11 o'clock. Biopsy of probable cystic changes at 2:30 in the right breast. EXAM: ULTRASOUND GUIDED RIGHT BREAST CORE NEEDLE BIOPSY COMPARISON:  Previous exam(s). PROCEDURE: I met with the patient and we discussed the procedure of ultrasound-guided biopsy, including benefits and alternatives. We discussed the high likelihood of a successful procedure. We discussed the risks of the procedure, including infection, bleeding, tissue injury, clip migration, and inadequate sampling. Informed written consent was given. The usual time-out protocol was performed immediately prior to the procedure. Lesion quadrant: Right breast, 11 o'clock, 8 cm from the nipple Using sterile technique and 1% Lidocaine as local anesthetic, under direct ultrasound visualization, a 12 gauge spring-loaded device was used to perform biopsy of the right  breast mass at 11 o'clock using a lateral approach. At the conclusion of the procedure a coil shaped tissue marker clip was deployed into the biopsy cavity. Follow up 2 view mammogram was performed and dictated separately. Lesion quadrant: Right breast, 230 Using sterile technique and 1% Lidocaine as local anesthetic, under direct ultrasound visualization, a 12 gauge spring-loaded device was used to perform biopsy of the 230 right breast mass using a medial approach. At the conclusion of the procedure a ribbon shaped tissue marker clip was deployed into the biopsy cavity. Follow up 2 view mammogram was performed and dictated separately. IMPRESSION: Ultrasound guided biopsy of 2 right breast masses. No apparent complications. Electronically Signed: By: Dorise Bullion III M.D On: 01/13/2021 14:57    ELIGIBLE FOR AVAILABLE RESEARCH PROTOCOL: no  ASSESSMENT: 75 y.o. [place] woman status post right breast upper outer quadrant biopsy 01/13/2021 for a clinical T2 N0, stage IB-IIA invasive ductal carcinoma, grade 2/3, estrogen and progesterone receptor positive, HER2 not amplified, with an MIB-1 of 15%  (1) definitive surgery pending  (2) Oncotype to be obtained from the definitive surgical sample  (3) adjuvant radiation  (4) antiestrogens  PLAN: I met today with Liel to review her new diagnosis. Specifically we discussed the biology of her breast cancer, its diagnosis, staging, treatment  options and prognosis. We first reviewed the fact that cancer is not one disease but more than 100 different diseases and that it is important to keep them separate-- otherwise when friends and relatives discuss their own cancer experiences with Darly confusion can result. Similarly we explained that if breast cancer spreads to the bone or liver, the patient would not have bone cancer or liver cancer, but breast cancer in the bone and breast cancer in the liver: one cancer in three places-- not 3 different cancers  which otherwise would have to be treated in 3 different ways.  We discussed the difference between local and systemic therapy. In terms of loco-regional treatment, lumpectomy plus radiation is equivalent to mastectomy as far as survival is concerned. For this reason, and because the cosmetic results are generally superior, we recommend breast conserving surgery.   We then discussed the rationale for systemic therapy. There is some risk that this cancer may have already spread to other parts of her body. Patients frequently ask at this point about bone scans, CAT scans and PET scans to find out if they have occult breast cancer somewhere else. The problem is that in early stage disease we are much more likely to find false positives then true cancers and this would expose the patient to unnecessary procedures as well as unnecessary radiation. Scans cannot answer the question the patient really would like to know, which is whether she has microscopic disease elsewhere in  her body. For those reasons we do not recommend them.  Of course we would proceed to aggressive evaluation of any symptoms that might suggest metastatic disease, but that is not the case here.  Next we went over the options for systemic therapy which are anti-estrogens, anti-HER-2 immunotherapy, and chemotherapy. Camelia does not meet criteria for anti-HER-2 immunotherapy. She is a good candidate for anti-estrogens.  The question of chemotherapy is more complicated. Chemotherapy is most effective in rapidly growing, aggressive tumors. It is much less effective in low-grade, slow growing cancers.Macil 's case is somewhere in between. For that reason we are going to request an Oncotype from the definitive surgical sample, as suggested by NCCN guidelines. That will help [place] make a definitive decision regarding chemotherapy in this case.  Yanessa has a good understanding of the overall plan. She agrees with it. She knows the goal of treatment in  her case is cure. She will call with any problems that may develop before her next visit here.  Total encounter time 65 minutes.Sarajane Jews C. Marrion Finan, MD 01/22/2021 6:41 PM Medical Oncology and Hematology [facility] [place], [place] [address] Tel. [phone]    Fax. [phone]   This document serves as a record of services personally performed by Lurline Del, MD. It was created on his behalf by Wilburn Mylar, a trained medical scribe. The creation of this record is based on the scribe's personal observations and the provider's statements to them.   I, Lurline Del MD, have reviewed the above documentation for accuracy and completeness, and I agree with the above.    *Total Encounter Time as defined by the Centers for Medicare and Medicaid Services includes, in addition to the face-to-face time of a patient visit (documented in the note above) non-face-to-face time: obtaining and reviewing outside history, ordering and reviewing medications, tests or procedures, care coordination (communications with other health care professionals or caregivers) and documentation in the medical record.

## 2021-01-23 ENCOUNTER — Telehealth: Payer: Self-pay | Admitting: Oncology

## 2021-01-23 NOTE — Telephone Encounter (Signed)
Scheduled appointment per 08/17 los. Left message.

## 2021-01-24 ENCOUNTER — Encounter: Payer: Self-pay | Admitting: *Deleted

## 2021-01-24 ENCOUNTER — Other Ambulatory Visit: Payer: Self-pay | Admitting: Surgery

## 2021-01-24 DIAGNOSIS — Z853 Personal history of malignant neoplasm of breast: Secondary | ICD-10-CM

## 2021-01-27 ENCOUNTER — Other Ambulatory Visit: Payer: Self-pay | Admitting: Surgery

## 2021-01-27 DIAGNOSIS — M545 Low back pain, unspecified: Secondary | ICD-10-CM | POA: Diagnosis not present

## 2021-01-27 DIAGNOSIS — C50411 Malignant neoplasm of upper-outer quadrant of right female breast: Secondary | ICD-10-CM | POA: Diagnosis not present

## 2021-01-27 DIAGNOSIS — I10 Essential (primary) hypertension: Secondary | ICD-10-CM | POA: Diagnosis not present

## 2021-01-27 DIAGNOSIS — K5904 Chronic idiopathic constipation: Secondary | ICD-10-CM | POA: Diagnosis not present

## 2021-01-27 DIAGNOSIS — Z170 Estrogen receptor positive status [ER+]: Secondary | ICD-10-CM | POA: Diagnosis not present

## 2021-01-27 DIAGNOSIS — R82998 Other abnormal findings in urine: Secondary | ICD-10-CM | POA: Diagnosis not present

## 2021-01-27 DIAGNOSIS — Z0000 Encounter for general adult medical examination without abnormal findings: Secondary | ICD-10-CM | POA: Diagnosis not present

## 2021-01-27 DIAGNOSIS — R197 Diarrhea, unspecified: Secondary | ICD-10-CM | POA: Diagnosis not present

## 2021-01-27 DIAGNOSIS — E785 Hyperlipidemia, unspecified: Secondary | ICD-10-CM | POA: Diagnosis not present

## 2021-01-27 DIAGNOSIS — E669 Obesity, unspecified: Secondary | ICD-10-CM | POA: Diagnosis not present

## 2021-01-27 DIAGNOSIS — Z1331 Encounter for screening for depression: Secondary | ICD-10-CM | POA: Diagnosis not present

## 2021-01-27 DIAGNOSIS — G4700 Insomnia, unspecified: Secondary | ICD-10-CM | POA: Diagnosis not present

## 2021-01-27 DIAGNOSIS — F419 Anxiety disorder, unspecified: Secondary | ICD-10-CM | POA: Diagnosis not present

## 2021-01-27 DIAGNOSIS — Z1339 Encounter for screening examination for other mental health and behavioral disorders: Secondary | ICD-10-CM | POA: Diagnosis not present

## 2021-01-27 DIAGNOSIS — M79671 Pain in right foot: Secondary | ICD-10-CM | POA: Diagnosis not present

## 2021-01-28 ENCOUNTER — Encounter: Payer: Self-pay | Admitting: *Deleted

## 2021-01-28 ENCOUNTER — Telehealth: Payer: Self-pay | Admitting: *Deleted

## 2021-01-28 NOTE — Telephone Encounter (Signed)
Spoke to pt concerning [place] from 8.17.22. Denies questions or concerns regarding dx or treatment care plan. Confirmed future appts. Encourage pt to call with needs. Received verbal understanding.

## 2021-01-31 ENCOUNTER — Ambulatory Visit (HOSPITAL_COMMUNITY)
Admission: RE | Admit: 2021-01-31 | Discharge: 2021-01-31 | Disposition: A | Discharge status: Home / Self Care | Payer: Medicare Other | Source: Ambulatory Visit | Attending: Surgery | Admitting: Surgery

## 2021-01-31 ENCOUNTER — Other Ambulatory Visit: Payer: Self-pay

## 2021-01-31 DIAGNOSIS — C50411 Malignant neoplasm of upper-outer quadrant of right female breast: Secondary | ICD-10-CM

## 2021-01-31 DIAGNOSIS — N6012 Diffuse cystic mastopathy of left breast: Secondary | ICD-10-CM | POA: Diagnosis not present

## 2021-01-31 DIAGNOSIS — Z170 Estrogen receptor positive status [ER+]: Secondary | ICD-10-CM | POA: Insufficient documentation

## 2021-01-31 DIAGNOSIS — N6011 Diffuse cystic mastopathy of right breast: Secondary | ICD-10-CM | POA: Diagnosis not present

## 2021-01-31 MED ORDER — GADOBUTROL 1 MMOL/ML IV SOLN
10.0000 mL | Freq: Once | INTRAVENOUS | Status: AC | PRN
  Administered 2021-01-31: 10 mL via INTRAVENOUS

## 2021-02-03 ENCOUNTER — Other Ambulatory Visit: Payer: Self-pay | Admitting: Surgery

## 2021-02-03 ENCOUNTER — Other Ambulatory Visit: Payer: Self-pay | Admitting: *Deleted

## 2021-02-03 ENCOUNTER — Encounter: Payer: Self-pay | Admitting: *Deleted

## 2021-02-03 DIAGNOSIS — Z170 Estrogen receptor positive status [ER+]: Secondary | ICD-10-CM

## 2021-02-03 DIAGNOSIS — C50411 Malignant neoplasm of upper-outer quadrant of right female breast: Secondary | ICD-10-CM

## 2021-02-04 ENCOUNTER — Encounter: Payer: Self-pay | Admitting: *Deleted

## 2021-02-06 ENCOUNTER — Other Ambulatory Visit: Payer: Self-pay | Admitting: Body Imaging

## 2021-02-06 ENCOUNTER — Other Ambulatory Visit: Payer: Self-pay

## 2021-02-06 ENCOUNTER — Ambulatory Visit
Admission: RE | Admit: 2021-02-06 | Discharge: 2021-02-06 | Disposition: A | Discharge status: Home / Self Care | Payer: Medicare Other | Source: Ambulatory Visit | Attending: Surgery | Admitting: Surgery

## 2021-02-06 DIAGNOSIS — C50411 Malignant neoplasm of upper-outer quadrant of right female breast: Secondary | ICD-10-CM

## 2021-02-06 DIAGNOSIS — R928 Other abnormal and inconclusive findings on diagnostic imaging of breast: Secondary | ICD-10-CM | POA: Diagnosis not present

## 2021-02-06 DIAGNOSIS — Z170 Estrogen receptor positive status [ER+]: Secondary | ICD-10-CM

## 2021-02-06 DIAGNOSIS — N6011 Diffuse cystic mastopathy of right breast: Secondary | ICD-10-CM | POA: Diagnosis not present

## 2021-02-06 MED ORDER — GADOBUTROL 1 MMOL/ML IV SOLN
9.0000 mL | Freq: Once | INTRAVENOUS | Status: AC | PRN
  Administered 2021-02-06: 9 mL via INTRAVENOUS

## 2021-02-07 ENCOUNTER — Encounter: Payer: Self-pay | Admitting: *Deleted

## 2021-02-11 ENCOUNTER — Encounter (HOSPITAL_BASED_OUTPATIENT_CLINIC_OR_DEPARTMENT_OTHER): Payer: Self-pay | Admitting: Surgery

## 2021-02-11 ENCOUNTER — Other Ambulatory Visit: Payer: Self-pay | Admitting: Oncology

## 2021-02-11 ENCOUNTER — Other Ambulatory Visit: Payer: Self-pay

## 2021-02-12 NOTE — Progress Notes (Signed)

## 2021-02-13 ENCOUNTER — Other Ambulatory Visit: Payer: Self-pay | Admitting: Surgery

## 2021-02-13 DIAGNOSIS — Z853 Personal history of malignant neoplasm of breast: Secondary | ICD-10-CM

## 2021-02-14 ENCOUNTER — Ambulatory Visit
Admission: RE | Admit: 2021-02-14 | Discharge: 2021-02-14 | Disposition: A | Discharge status: Home / Self Care | Payer: Medicare Other | Source: Ambulatory Visit | Attending: Surgery | Admitting: Surgery

## 2021-02-14 ENCOUNTER — Other Ambulatory Visit: Payer: Self-pay

## 2021-02-14 DIAGNOSIS — Z853 Personal history of malignant neoplasm of breast: Secondary | ICD-10-CM

## 2021-02-14 DIAGNOSIS — C50411 Malignant neoplasm of upper-outer quadrant of right female breast: Secondary | ICD-10-CM | POA: Diagnosis not present

## 2021-02-15 NOTE — H&P (Signed)
PROVIDER:  Beverlee Nims, MD   MRN: [id number] DOB: 11-09-1945 DATE OF ENCOUNTER: 01/22/2021   Subjective    Chief Complaint: Malignant Neoplasm of upper-outer quadrant of right breast        History of Present Illness: Chelsea Lee is a 75 y.o. female who is seen today as an office consultation at the request of Dr. Pasty Arch for evaluation of Malignant Neoplasm of upper-outer quadrant of right breast  .     This is a pleasant 75 anal tone.  Recent scans likely which is a benign several suspicious areas in her right breast.  1 year impaired.  2.6 cm and the other area measured 3.  A 3 cm area was biopsied showing described cystic changes.  The 2.6 cm area check with invasive and in situ ductal carcinoma.  It was 95% ER/PR positive, HER2 negative, Ki-67 15%.  She has no previous family history of breast cancer.  She has had no previous problems with her breast other than a benign biopsy years ago for the left breast.  She has no previous problems with anesthesia no cardiopulmonary issues.  She has had no nipple discharge.     Review of Systems: A complete review of systems was obtained from the patient.  I have reviewed this information and discussed as appropriate with the patient.  See HPI as well for other ROS.   Review of Systems  All other systems reviewed and are negative.       Medical History: Past Medical History      Past Medical History:  Diagnosis Date   Anxiety     Arthritis          There is no problem list on file for this patient.     Past Surgical History       Past Surgical History:  Procedure Laterality Date   Right Foot        Date Unknown        Allergies  No Known Allergies           Current Outpatient Medications on File Prior to Visit  Medication Sig Dispense Refill   ALPRAZolam (XANAX) 1 MG tablet Take 1 mg by mouth at bedtime as needed       gabapentin (NEURONTIN) 300 MG capsule TAKE 1 CAPSULE BY MOUTH THREE TIMES DAILY FOR 10  DAYS OR AS NEEDED       lubiprostone (AMITIZA) 8 MCG capsule TAKE 1 CAPSULE BY MOUTH  WITH FOOD AND WATER TWICE  DAILY       simvastatin (ZOCOR) 40 MG tablet Take 1 tablet by mouth at bedtime       venlafaxine (EFFEXOR) 75 MG tablet Take by mouth        No current facility-administered medications on file prior to visit.      Family History       Family History  Problem Relation Age of Onset   Coronary Artery Disease (Blocked arteries around heart) Father     Stroke Brother          Social History       Tobacco Use  Smoking Status Never Smoker  Smokeless Tobacco Never Used      Social History  Social History        Socioeconomic History   Marital status: Married  Tobacco Use   Smoking status: Never Smoker   Smokeless tobacco: Never Used  Scientific laboratory technician Use: Never used  Substance and Sexual Activity  Alcohol use: Yes   Drug use: Never        Objective:      Vitals: BP 142/66, RR 16, HR 64, T 98.4 Physical Exam    She appears well on exam   There are no palpable breast masses in either breast.  The nipple-areolar complexes are normal.  There is no axillary adenopathy either side.  Lungs clear  CV RRR  Abdomen soft, NT     Labs, Imaging and Diagnostic Testing: I have reviewed her mammograms, does not have any pathology   Assessment and Plan:  Right breast cancer   I have reviewed her notes in the electronic medical records.  We have discussed her extensively during multidisciplinary breast cancer conference this morning as well.  She has both invasive and in situ ductal carcinoma breast.  Again it measures 2.6 cm in size.  Given the size of the malignancy and the density of her breast, bilateral breast MRI is strongly recommended preoperatively.  We then discussed surgical options which included breast conservation versus mastectomy.  She is interested in breast conservation so again an MRI is recommended preoperatively.  I next discussed proceeding  with a radioactive seed guided right breast lumpectomy.  I explained the surgical procedure in detail.  We discussed the risk of surgery which includes but is not limited to bleeding, infection, the need for further surgery if the margins are positive, cardiopulmonary issues, postop recovery, etc.   I will call back with the results of the MRI.  We will tentatively go ahead and schedule surgery which can be changed pending on MRI results.  She and her family agree with plans.  Addendum: She has since had an MRI of the breast showing a separate area in the right breast which was biopsied and was found to be benign.  After further discussion, the plan is still to proceed with a radioactive seed guided right breast lumpectomy and a right axillary deep sentinel lymph node biopsy.  Again, she agrees with the plans.

## 2021-02-17 ENCOUNTER — Ambulatory Visit
Admission: RE | Admit: 2021-02-17 | Discharge: 2021-02-17 | Disposition: A | Discharge status: Home / Self Care | Payer: Medicare Other | Source: Ambulatory Visit | Attending: Surgery | Admitting: Surgery

## 2021-02-17 ENCOUNTER — Ambulatory Visit (HOSPITAL_BASED_OUTPATIENT_CLINIC_OR_DEPARTMENT_OTHER)
Admission: RE | Admit: 2021-02-17 | Discharge: 2021-02-17 | Disposition: A | Discharge status: Home / Self Care | Payer: Medicare Other | Attending: Surgery | Admitting: Surgery

## 2021-02-17 ENCOUNTER — Ambulatory Visit (HOSPITAL_BASED_OUTPATIENT_CLINIC_OR_DEPARTMENT_OTHER): Payer: Medicare Other | Admitting: Anesthesiology

## 2021-02-17 ENCOUNTER — Encounter (HOSPITAL_BASED_OUTPATIENT_CLINIC_OR_DEPARTMENT_OTHER): Payer: Self-pay | Admitting: Surgery

## 2021-02-17 ENCOUNTER — Other Ambulatory Visit: Payer: Self-pay

## 2021-02-17 ENCOUNTER — Encounter (HOSPITAL_BASED_OUTPATIENT_CLINIC_OR_DEPARTMENT_OTHER)
Admission: RE | Disposition: A | Discharge status: Home / Self Care | Payer: Self-pay | Source: Home / Self Care | Attending: Surgery

## 2021-02-17 ENCOUNTER — Ambulatory Visit (HOSPITAL_COMMUNITY)
Admission: RE | Admit: 2021-02-17 | Discharge: 2021-02-17 | Disposition: A | Discharge status: Home / Self Care | Payer: Medicare Other | Source: Ambulatory Visit | Attending: Surgery | Admitting: Surgery

## 2021-02-17 DIAGNOSIS — E785 Hyperlipidemia, unspecified: Secondary | ICD-10-CM | POA: Diagnosis not present

## 2021-02-17 DIAGNOSIS — Z79899 Other long term (current) drug therapy: Secondary | ICD-10-CM | POA: Diagnosis not present

## 2021-02-17 DIAGNOSIS — C50411 Malignant neoplasm of upper-outer quadrant of right female breast: Secondary | ICD-10-CM | POA: Diagnosis not present

## 2021-02-17 DIAGNOSIS — Z170 Estrogen receptor positive status [ER+]: Secondary | ICD-10-CM | POA: Diagnosis not present

## 2021-02-17 DIAGNOSIS — Z853 Personal history of malignant neoplasm of breast: Secondary | ICD-10-CM

## 2021-02-17 DIAGNOSIS — C50911 Malignant neoplasm of unspecified site of right female breast: Secondary | ICD-10-CM | POA: Diagnosis not present

## 2021-02-17 DIAGNOSIS — G8918 Other acute postprocedural pain: Secondary | ICD-10-CM | POA: Diagnosis not present

## 2021-02-17 DIAGNOSIS — N611 Abscess of the breast and nipple: Secondary | ICD-10-CM | POA: Diagnosis not present

## 2021-02-17 DIAGNOSIS — N641 Fat necrosis of breast: Secondary | ICD-10-CM | POA: Diagnosis not present

## 2021-02-17 HISTORY — PX: BREAST LUMPECTOMY WITH RADIOACTIVE SEED AND SENTINEL LYMPH NODE BIOPSY: SHX6550

## 2021-02-17 SURGERY — BREAST LUMPECTOMY WITH RADIOACTIVE SEED AND SENTINEL LYMPH NODE BIOPSY
Anesthesia: General | Site: Breast | Laterality: Right

## 2021-02-17 MED ORDER — LIDOCAINE HCL (PF) 1 % IJ SOLN
INTRAMUSCULAR | Status: AC
  Filled 2021-02-17: qty 5

## 2021-02-17 MED ORDER — FENTANYL CITRATE (PF) 100 MCG/2ML IJ SOLN
INTRAMUSCULAR | Status: DC | PRN
  Administered 2021-02-17: 25 ug via INTRAVENOUS

## 2021-02-17 MED ORDER — FENTANYL CITRATE (PF) 100 MCG/2ML IJ SOLN
INTRAMUSCULAR | Status: AC
  Filled 2021-02-17: qty 2

## 2021-02-17 MED ORDER — PROPOFOL 10 MG/ML IV BOLUS
INTRAVENOUS | Status: AC
  Filled 2021-02-17: qty 20

## 2021-02-17 MED ORDER — TRAMADOL HCL 50 MG PO TABS
50.0000 mg | ORAL_TABLET | Freq: Four times a day (QID) | ORAL | 0 refills | Status: DC | PRN
Start: 2021-02-17 — End: 2021-03-26

## 2021-02-17 MED ORDER — PROMETHAZINE HCL 25 MG/ML IJ SOLN: 6.2500 mg | INTRAMUSCULAR | Status: DC | PRN

## 2021-02-17 MED ORDER — EPHEDRINE SULFATE 50 MG/ML IJ SOLN
INTRAMUSCULAR | Status: DC | PRN
  Administered 2021-02-17 (×2): 5 mg via INTRAVENOUS

## 2021-02-17 MED ORDER — BUPIVACAINE-EPINEPHRINE 0.5% -1:200000 IJ SOLN
INTRAMUSCULAR | Status: DC | PRN
  Administered 2021-02-17: 13 mL

## 2021-02-17 MED ORDER — BUPIVACAINE-EPINEPHRINE (PF) 0.5% -1:200000 IJ SOLN
INTRAMUSCULAR | Status: DC | PRN
  Administered 2021-02-17: 30 mL

## 2021-02-17 MED ORDER — PROPOFOL 10 MG/ML IV BOLUS
INTRAVENOUS | Status: DC | PRN
  Administered 2021-02-17: 160 mg via INTRAVENOUS

## 2021-02-17 MED ORDER — TECHNETIUM TC 99M TILMANOCEPT KIT
1.0000 | PACK | Freq: Once | INTRAVENOUS | Status: AC | PRN
  Administered 2021-02-17: 1 via INTRADERMAL

## 2021-02-17 MED ORDER — HYDROMORPHONE HCL 1 MG/ML IJ SOLN
INTRAMUSCULAR | Status: AC
  Filled 2021-02-17: qty 0.5

## 2021-02-17 MED ORDER — CLONIDINE HCL (ANALGESIA) 100 MCG/ML EP SOLN
EPIDURAL | Status: DC | PRN
  Administered 2021-02-17: 80 ug

## 2021-02-17 MED ORDER — ONDANSETRON HCL 4 MG/2ML IJ SOLN
INTRAMUSCULAR | Status: DC | PRN
  Administered 2021-02-17: 4 mg via INTRAVENOUS

## 2021-02-17 MED ORDER — LACTATED RINGERS IV SOLN: INTRAVENOUS | Status: DC

## 2021-02-17 MED ORDER — ONDANSETRON HCL 4 MG/2ML IJ SOLN
INTRAMUSCULAR | Status: AC
  Filled 2021-02-17: qty 2

## 2021-02-17 MED ORDER — LIDOCAINE 2% (20 MG/ML) 5 ML SYRINGE
INTRAMUSCULAR | Status: AC
  Filled 2021-02-17: qty 5

## 2021-02-17 MED ORDER — CEFAZOLIN SODIUM-DEXTROSE 2-4 GM/100ML-% IV SOLN
INTRAVENOUS | Status: AC
  Filled 2021-02-17: qty 100

## 2021-02-17 MED ORDER — CEFAZOLIN SODIUM-DEXTROSE 2-4 GM/100ML-% IV SOLN
2.0000 g | INTRAVENOUS | Status: AC
  Administered 2021-02-17: 2 g via INTRAVENOUS

## 2021-02-17 MED ORDER — ACETAMINOPHEN 500 MG PO TABS
1000.0000 mg | ORAL_TABLET | ORAL | Status: AC
  Administered 2021-02-17: 1000 mg via ORAL

## 2021-02-17 MED ORDER — OXYCODONE HCL 5 MG PO TABS
5.0000 mg | ORAL_TABLET | Freq: Once | ORAL | Status: AC | PRN
  Administered 2021-02-17: 5 mg via ORAL

## 2021-02-17 MED ORDER — MIDAZOLAM HCL 2 MG/2ML IJ SOLN
INTRAMUSCULAR | Status: AC
  Filled 2021-02-17: qty 2

## 2021-02-17 MED ORDER — CHLORHEXIDINE GLUCONATE CLOTH 2 % EX PADS: 6.0000 | MEDICATED_PAD | Freq: Once | CUTANEOUS | Status: DC

## 2021-02-17 MED ORDER — DEXAMETHASONE SODIUM PHOSPHATE 4 MG/ML IJ SOLN
INTRAMUSCULAR | Status: DC | PRN
  Administered 2021-02-17: 4 mg

## 2021-02-17 MED ORDER — FENTANYL CITRATE (PF) 100 MCG/2ML IJ SOLN
100.0000 ug | Freq: Once | INTRAMUSCULAR | Status: AC
  Administered 2021-02-17: 100 ug via INTRAVENOUS

## 2021-02-17 MED ORDER — OXYCODONE HCL 5 MG PO TABS
ORAL_TABLET | ORAL | Status: AC
  Filled 2021-02-17: qty 1

## 2021-02-17 MED ORDER — MIDAZOLAM HCL 2 MG/2ML IJ SOLN
2.0000 mg | Freq: Once | INTRAMUSCULAR | Status: AC
  Administered 2021-02-17: 2 mg via INTRAVENOUS

## 2021-02-17 MED ORDER — DEXAMETHASONE SODIUM PHOSPHATE 10 MG/ML IJ SOLN
INTRAMUSCULAR | Status: DC | PRN
  Administered 2021-02-17: 5 mg via INTRAVENOUS

## 2021-02-17 MED ORDER — HYDROMORPHONE HCL 1 MG/ML IJ SOLN
0.2500 mg | INTRAMUSCULAR | Status: DC | PRN
  Administered 2021-02-17 (×2): 0.5 mg via INTRAVENOUS

## 2021-02-17 MED ORDER — NON FORMULARY
Status: DC | PRN
  Administered 2021-02-17: 2 mL via INTRAMUSCULAR

## 2021-02-17 MED ORDER — LIDOCAINE HCL (CARDIAC) PF 100 MG/5ML IV SOSY
PREFILLED_SYRINGE | INTRAVENOUS | Status: DC | PRN
  Administered 2021-02-17: 80 mg via INTRAVENOUS

## 2021-02-17 MED ORDER — EPHEDRINE 5 MG/ML INJ
INTRAVENOUS | Status: AC
  Filled 2021-02-17: qty 5

## 2021-02-17 MED ORDER — ACETAMINOPHEN 500 MG PO TABS
ORAL_TABLET | ORAL | Status: AC
  Filled 2021-02-17: qty 2

## 2021-02-17 MED ORDER — OXYCODONE HCL 5 MG/5ML PO SOLN: 5.0000 mg | Freq: Once | ORAL | Status: AC | PRN

## 2021-02-17 SURGICAL SUPPLY — 45 items
ADH SKN CLS APL DERMABOND .7 (GAUZE/BANDAGES/DRESSINGS) ×1
APL PRP STRL LF DISP 70% ISPRP (MISCELLANEOUS) ×1
APPLIER CLIP 9.375 MED OPEN (MISCELLANEOUS) ×2
APR CLP MED 9.3 20 MLT OPN (MISCELLANEOUS) ×1
BINDER BREAST XXLRG (GAUZE/BANDAGES/DRESSINGS) ×1 IMPLANT
BLADE SURG 15 STRL LF DISP TIS (BLADE) ×1 IMPLANT
BLADE SURG 15 STRL SS (BLADE) ×2
CANISTER SUCT 1200ML W/VALVE (MISCELLANEOUS) ×1 IMPLANT
CHLORAPREP W/TINT 26 (MISCELLANEOUS) ×2 IMPLANT
CLIP APPLIE 9.375 MED OPEN (MISCELLANEOUS) ×1 IMPLANT
COVER BACK TABLE 60X90IN (DRAPES) ×2 IMPLANT
COVER MAYO STAND STRL (DRAPES) ×2 IMPLANT
COVER PROBE W GEL 5X96 (DRAPES) ×2 IMPLANT
DERMABOND ADVANCED (GAUZE/BANDAGES/DRESSINGS) ×1
DERMABOND ADVANCED .7 DNX12 (GAUZE/BANDAGES/DRESSINGS) ×1 IMPLANT
DRAPE LAPAROSCOPIC ABDOMINAL (DRAPES) ×2 IMPLANT
DRAPE UTILITY XL STRL (DRAPES) ×2 IMPLANT
ELECT REM PT RETURN 9FT ADLT (ELECTROSURGICAL) ×2
ELECTRODE REM PT RTRN 9FT ADLT (ELECTROSURGICAL) ×1 IMPLANT
GLOVE SURG POLYISO LF SZ6.5 (GLOVE) ×2 IMPLANT
GLOVE SURG SIGNA 7.5 PF LTX (GLOVE) ×2 IMPLANT
GLOVE SURG UNDER POLY LF SZ7 (GLOVE) ×3 IMPLANT
GOWN STRL REUS W/ TWL LRG LVL3 (GOWN DISPOSABLE) ×1 IMPLANT
GOWN STRL REUS W/ TWL XL LVL3 (GOWN DISPOSABLE) ×1 IMPLANT
GOWN STRL REUS W/TWL LRG LVL3 (GOWN DISPOSABLE) ×4
GOWN STRL REUS W/TWL XL LVL3 (GOWN DISPOSABLE) ×2
KIT MARKER MARGIN INK (KITS) ×2 IMPLANT
NDL HYPO 25X1 1.5 SAFETY (NEEDLE) ×1 IMPLANT
NDL SAFETY ECLIPSE 18X1.5 (NEEDLE) ×1 IMPLANT
NEEDLE HYPO 18GX1.5 SHARP (NEEDLE) ×2
NEEDLE HYPO 25X1 1.5 SAFETY (NEEDLE) ×2 IMPLANT
NS IRRIG 1000ML POUR BTL (IV SOLUTION) ×2 IMPLANT
PACK BASIN DAY SURGERY FS (CUSTOM PROCEDURE TRAY) ×2 IMPLANT
PENCIL SMOKE EVACUATOR (MISCELLANEOUS) ×2 IMPLANT
SLEEVE SCD COMPRESS KNEE MED (STOCKING) ×2 IMPLANT
SPONGE T-LAP 4X18 ~~LOC~~+RFID (SPONGE) ×2 IMPLANT
SUT MNCRL AB 4-0 PS2 18 (SUTURE) ×3 IMPLANT
SUT VIC AB 3-0 SH 27 (SUTURE) ×4
SUT VIC AB 3-0 SH 27X BRD (SUTURE) ×1 IMPLANT
SYR CONTROL 10ML LL (SYRINGE) ×2 IMPLANT
TOWEL GREEN STERILE FF (TOWEL DISPOSABLE) ×2 IMPLANT
TRACER MAGTRACE VIAL (MISCELLANEOUS) ×1 IMPLANT
TRAY FAXITRON CT DISP (TRAY / TRAY PROCEDURE) ×2 IMPLANT
TUBE CONNECTING 20X1/4 (TUBING) ×1 IMPLANT
YANKAUER SUCT BULB TIP NO VENT (SUCTIONS) ×1 IMPLANT

## 2021-02-17 NOTE — Anesthesia Procedure Notes (Signed)
Procedure Name: LMA Insertion Date/Time: 02/17/2021 8:44 AM Performed by: Lavonia Dana, CRNA Pre-anesthesia Checklist: Patient identified, Emergency Drugs available, Suction available and Patient being monitored Patient Re-evaluated:Patient Re-evaluated prior to induction Oxygen Delivery Method: Circle system utilized Preoxygenation: Pre-oxygenation with 100% oxygen Induction Type: IV induction Ventilation: Mask ventilation without difficulty LMA: LMA inserted LMA Size: 4.0 Number of attempts: 1 Airway Equipment and Method: Bite block Placement Confirmation: positive ETCO2 Tube secured with: Tape Dental Injury: Teeth and Oropharynx as per pre-operative assessment

## 2021-02-17 NOTE — Op Note (Signed)
   Chelsea Lee 02/17/2021   Pre-op Diagnosis: RIGHT BREAST CANCER     Post-op Diagnosis: same  Procedure(s): RIGHT BREAST LUMPECTOMY WITH RADIOACTIVE SEED AND DEEP RIGHT AXILLARY SENTINEL LYMPH NODE BIOPSY  Surgeon(s): Coralie Keens, MD Change, [name], MD [facility] Resident  Anesthesia: General  Staff:  Circulator: Izora Ribas, RN Scrub Person: Lorenza Burton, CST  Estimated Blood Loss: Minimal               Specimens: sent to path  Indications: This is a 75 year old female was found to have suspicious area on screening mammography on the right breast.  This measured almost 3 cm in size.  It was biopsy showed invasive ductal carcinoma.  She had a follow-up MRI confirming the mass.  A separate area was biopsied showing fibrocystic changes.  After discussion with the family decision was made to proceed with a radioactive seed guided lumpectomy and right axillary sentinel lymph node biopsy  Procedure: The patient was identified in the preoperative holding area.  A timeout was called.  I marked her right breast.  Under sterile technique I injected lidocaine underneath the right nipple areolar complex and then injected mag trace underneath the nipple.  The radiation technologist also injected radioactive tracer around the nipple areolar complex.  The patient was then taken to the operating room.  She is placed upon the operating table general anesthesia was induced.  Right breast was massaged.  Her right breast and axilla were then prepped and draped in usual sterile fashion.  Using neoprobe, the radioactive seed was located deep in the breast near the 11 o'clock position.  We anesthetized skin around the areola with Marcaine and made an incision with a scalpel.  We then dissected down with the aid of the neoprobe to the radioactive seed.  The seed was close to the chest wall.  Laterally there was a significant mount of fibrocystic tissue.  With the aid of the neoprobe we performed a  wide lumpectomy try and stay widely around the radioactive seed given the size of the tumor.  Once the lumpectomy specimen was removed, the margins were marked with paint and x-rays performed on specimen.  This confirmed that the radioactive seed and previous tissue marker were in the specimen.  The specimen was then sent to pathology for evaluation.  We then identified an area of increased uptake in the right axilla.  I anesthetized skin with Marcaine and made incision with a scalpel.  I then dissected down into the deep axillary tissue with the cautery.  Using the mag trace probe I was able to identify 2 sentinel lymph nodes which were excised and sent to pathology for evaluation.  I achieved hemostasis with the cautery and surgical clip.  No further increased uptake was identified.  We evaluated the lumpectomy site and hemostasis peer to be achieved.  We placed surgical clips around the periphery of the lumpectomy cavity.  Both incisions were then closed with interrupted 3-0 Vicryl sutures and running 4-0 Monocryl sutures.  Dermabond and a breast binder were then applied.  The patient tolerated the procedure well.  All the counts were correct at the end of the procedure.  The patient was then extubated in the operating room and taken in a stable addition to the recovery room.          Coralie Keens   Date: 02/17/2021  Time: 9:58 AM

## 2021-02-17 NOTE — Progress Notes (Signed)
Assisted Dr. Germeroth with right, ultrasound guided, pectoralis block. Side rails up, monitors on throughout procedure. See vital signs in flow sheet. Tolerated Procedure well. 

## 2021-02-17 NOTE — Anesthesia Preprocedure Evaluation (Addendum)
Anesthesia Evaluation  Patient identified by MRN, date of birth, ID band Patient awake    Reviewed: Allergy & Precautions, NPO status , Patient's Chart, lab work & pertinent test results  Airway Mallampati: II  TM Distance: >3 FB Neck ROM: Full    Dental  (+) Dental Advisory Given   Pulmonary neg pulmonary ROS,    Pulmonary exam normal breath sounds clear to auscultation       Cardiovascular negative cardio ROS Normal cardiovascular exam Rhythm:Regular Rate:Normal     Neuro/Psych PSYCHIATRIC DISORDERS Anxiety  Neuromuscular disease    GI/Hepatic negative GI ROS, Neg liver ROS,   Endo/Other  negative endocrine ROS  Renal/GU Renal disease     Musculoskeletal  (+) Arthritis ,   Abdominal   Peds  Hematology negative hematology ROS (+)   Anesthesia Other Findings   Reproductive/Obstetrics                            Anesthesia Physical Anesthesia Plan  ASA: 2  Anesthesia Plan: General   Post-op Pain Management: GA combined w/ Regional for post-op pain   Induction: Intravenous  PONV Risk Score and Plan: 4 or greater and Ondansetron, Dexamethasone, Midazolam, Diphenhydramine and Treatment may vary due to age or medical condition  Airway Management Planned: LMA  Additional Equipment: None  Intra-op Plan:   Post-operative Plan: Extubation in OR  Informed Consent: I have reviewed the patients History and Physical, chart, labs and discussed the procedure including the risks, benefits and alternatives for the proposed anesthesia with the patient or authorized representative who has indicated his/her understanding and acceptance.     Dental advisory given  Plan Discussed with: CRNA  Anesthesia Plan Comments:        Anesthesia Quick Evaluation

## 2021-02-17 NOTE — Transfer of Care (Signed)
Immediate Anesthesia Transfer of Care Note  Patient: Chelsea Lee  Procedure(s) Performed: RIGHT BREAST LUMPECTOMY WITH RADIOACTIVE SEED AND SENTINEL LYMPH NODE BIOPSY (Right: Breast)  Patient Location: PACU  Anesthesia Type:GA combined with regional for post-op pain  Level of Consciousness: drowsy  Airway & Oxygen Therapy: Patient Spontanous Breathing and Patient connected to face mask oxygen  Post-op Assessment: Report given to RN and Post -op Vital signs reviewed and stable  Post vital signs: Reviewed and stable  Last Vitals:  Vitals Value Taken Time  BP 129/80 02/17/21 1009  Temp    Pulse 80 02/17/21 1012  Resp 12 02/17/21 1012  SpO2 92 % 02/17/21 1012  Vitals shown include unvalidated device data.  Last Pain:  Vitals:   02/17/21 0727  TempSrc: Oral  PainSc: 0-No pain         Complications: No notable events documented.

## 2021-02-17 NOTE — Interval H&P Note (Signed)
History and Physical Interval Note: no change in H and P  02/17/2021 7:15 AM  Chelsea Lee  has presented today for surgery, with the diagnosis of RIGHT BREAST CANCER.  The various methods of treatment have been discussed with the patient and family. After consideration of risks, benefits and other options for treatment, the patient has consented to  Procedure(s): RIGHT BREAST LUMPECTOMY WITH RADIOACTIVE SEED AND SENTINEL LYMPH NODE BIOPSY (Right) as a surgical intervention.  The patient's history has been reviewed, patient examined, no change in status, stable for surgery.  I have reviewed the patient's chart and labs.  Questions were answered to the patient's satisfaction.     Coralie Keens

## 2021-02-17 NOTE — Anesthesia Procedure Notes (Signed)
Anesthesia Regional Block: Pectoralis block   Pre-Anesthetic Checklist: , timeout performed,  Correct Patient, Correct Site, Correct Laterality,  Correct Procedure, Correct Position, site marked,  Risks and benefits discussed,  Surgical consent,  Pre-op evaluation,  At surgeon's request and post-op pain management  Laterality: Right  Prep: chloraprep       Needles:   Needle Type: Stimiplex     Needle Length: 9cm      Additional Needles:   Procedures:,,,, ultrasound used (permanent image in chart),,    Narrative:  Start time: 02/17/2021 8:00 AM End time: 02/17/2021 8:20 AM Injection made incrementally with aspirations every 5 mL.  Performed by: Personally  Anesthesiologist: Nolon Nations, MD  Additional Notes: BP cuff, SpO2 and EKG monitors applied. Sedation begun.  Anesthetic injected incrementally, slowly, and after neg aspirations under direct ultrasound guidance. Good fascial spread noted. Patient tolerated well.

## 2021-02-17 NOTE — Discharge Instructions (Addendum)
[place] Office Phone Number [phone]  BREAST BIOPSY/ PARTIAL MASTECTOMY: POST OP INSTRUCTIONS  Always review your discharge instruction sheet given to you by the facility where your surgery was performed.  IF YOU HAVE DISABILITY OR FAMILY LEAVE FORMS, YOU MUST BRING THEM TO THE OFFICE FOR PROCESSING.  DO NOT GIVE THEM TO YOUR DOCTOR. Oxycodone given at 11:00 a.m. Tylenol 1000 mg given at 0730  A prescription for pain medication may be given to you upon discharge.  Take your pain medication as prescribed, if needed.  If narcotic pain medicine is not needed, then you may take acetaminophen (Tylenol) or ibuprofen (Advil) as needed. Take your usually prescribed medications unless otherwise directed If you need a refill on your pain medication, please contact your pharmacy.  They will contact our office to request authorization.  Prescriptions will not be filled after 5pm or on week-ends. You should eat very light the first 24 hours after surgery, such as soup, crackers, pudding, etc.  Resume your normal diet the day after surgery. Most patients will experience some swelling and bruising in the breast.  Ice packs and a good support bra will help.  Swelling and bruising can take several days to resolve.  It is common to experience some constipation if taking pain medication after surgery.  Increasing fluid intake and taking a stool softener will usually help or prevent this problem from occurring.  A mild laxative (Milk of Magnesia or Miralax) should be taken according to package directions if there are no bowel movements after 48 hours. Unless discharge instructions indicate otherwise, you may remove your bandages 24-48 hours after surgery, and you may shower at that time.  You may have steri-strips (small skin tapes) in place directly over the incision.  These strips should be left on the skin for 7-10 days.  If your surgeon used skin glue on the incision, you may shower in 24  hours.  The glue will flake off over the next 2-3 weeks.  Any sutures or staples will be removed at the office during your follow-up visit. ACTIVITIES:  You may resume regular daily activities (gradually increasing) beginning the next day.  Wearing a good support bra or sports bra minimizes pain and swelling.  You may have sexual intercourse when it is comfortable. You may drive when you no longer are taking prescription pain medication, you can comfortably wear a seatbelt, and you can safely maneuver your car and apply brakes. RETURN TO WORK:  ______________________________________________________________________________________ Dennis Bast should see your doctor in the office for a follow-up appointment approximately two weeks after your surgery.  Your doctor's nurse will typically make your follow-up appointment when she calls you with your pathology report.  Expect your pathology report 2-3 business days after your surgery.  You may call to check if you do not hear from [place] after three days. OTHER INSTRUCTIONS: OK TO REMOVE THE BINDER AND SHOWER STARTING TOMORROW. ICE PACK, TYLENOL, AND IBUPROFEN ALSO FOR PAIN NO VIGOROUS ACTIVITY FOR ONE WEEK _______________________________________________________________________________________________ _____________________________________________________________________________________________________________________________________ _____________________________________________________________________________________________________________________________________ _____________________________________________________________________________________________________________________________________  WHEN TO CALL YOUR DOCTOR: Fever over 101.0 Nausea and/or vomiting. Extreme swelling or bruising. Continued bleeding from incision. Increased pain, redness, or drainage from the incision.  The clinic staff is available to answer your questions during regular business hours.   Please don't hesitate to call and ask to speak to one of the nurses for clinical concerns.  If you have a medical emergency, go to the nearest emergency room or call 911.  A surgeon from [facility] is always on call at the hospital.  For further questions, please visit [email or web address]   Post Anesthesia Home Care Instructions  Activity: Get plenty of rest for the remainder of the day. A responsible individual must stay with you for 24 hours following the procedure.  For the next 24 hours, DO NOT: -Drive a car -Paediatric nurse -Drink alcoholic beverages -Take any medication unless instructed by your physician -Make any legal decisions or sign important papers.  Meals: Start with liquid foods such as gelatin or soup. Progress to regular foods as tolerated. Avoid greasy, spicy, heavy foods. If nausea and/or vomiting occur, drink only clear liquids until the nausea and/or vomiting subsides. Call your physician if vomiting continues.  Special Instructions/Symptoms: Your throat may feel dry or sore from the anesthesia or the breathing tube placed in your throat during surgery. If this causes discomfort, gargle with warm salt water. The discomfort should disappear within 24 hours.  If you had a scopolamine patch placed behind your ear for the management of post- operative nausea and/or vomiting:  1. The medication in the patch is effective for 72 hours, after which it should be removed.  Wrap patch in a tissue and discard in the trash. Wash hands thoroughly with soap and water. 2. You may remove the patch earlier than 72 hours if you experience unpleasant side effects which may include dry mouth, dizziness or visual disturbances. 3. Avoid touching the patch. Wash your hands with soap and water after contact with the patch.

## 2021-02-17 NOTE — Anesthesia Postprocedure Evaluation (Signed)
Anesthesia Post Note  Patient: Chelsea Lee  Procedure(s) Performed: RIGHT BREAST LUMPECTOMY WITH RADIOACTIVE SEED AND SENTINEL LYMPH NODE BIOPSY (Right: Breast)     Patient location during evaluation: PACU Anesthesia Type: General Level of consciousness: sedated and patient cooperative Pain management: pain level controlled Vital Signs Assessment: post-procedure vital signs reviewed and stable Respiratory status: spontaneous breathing Cardiovascular status: stable Anesthetic complications: no   No notable events documented.  Last Vitals:  Vitals:   02/17/21 1045 02/17/21 1055  BP: 109/60 119/63  Pulse: 64 69  Resp: 11 16  Temp:  (!) 36.3 C  SpO2: 93% 93%    Last Pain:  Vitals:   02/17/21 1116  TempSrc:   PainSc: [name]

## 2021-02-18 ENCOUNTER — Encounter (HOSPITAL_BASED_OUTPATIENT_CLINIC_OR_DEPARTMENT_OTHER): Payer: Self-pay | Admitting: Surgery

## 2021-02-18 LAB — SURGICAL PATHOLOGY

## 2021-02-20 ENCOUNTER — Encounter: Payer: Self-pay | Admitting: *Deleted

## 2021-02-20 ENCOUNTER — Telehealth: Payer: Self-pay | Admitting: *Deleted

## 2021-02-20 NOTE — Telephone Encounter (Signed)
Received order for oncotype testing. Requisition faxed to pathology and [facility] °

## 2021-03-03 ENCOUNTER — Encounter (HOSPITAL_COMMUNITY): Payer: Self-pay

## 2021-03-03 ENCOUNTER — Encounter: Payer: Self-pay | Admitting: Oncology

## 2021-03-05 ENCOUNTER — Telehealth: Payer: Self-pay | Admitting: *Deleted

## 2021-03-05 ENCOUNTER — Encounter: Payer: Self-pay | Admitting: *Deleted

## 2021-03-05 DIAGNOSIS — Z170 Estrogen receptor positive status [ER+]: Secondary | ICD-10-CM

## 2021-03-05 DIAGNOSIS — C50411 Malignant neoplasm of upper-outer quadrant of right female breast: Secondary | ICD-10-CM

## 2021-03-05 NOTE — Telephone Encounter (Signed)
Received oncotype score of 12. Physician team notified. Called pt with results and discussed chemo not recommended.  Informed next step is xrt with Dr. Sondra Come. Received verbal understanding.  Referral placed for pt to see Dr. Sondra Come.

## 2021-03-06 ENCOUNTER — Telehealth: Payer: Self-pay | Admitting: Radiation Oncology

## 2021-03-06 NOTE — Telephone Encounter (Signed)
LVM to sched appt for Dr. Sondra Come

## 2021-03-11 NOTE — Progress Notes (Signed)
Location of Breast Cancer: upper-outer quadrant of right breast    Histology per Pathology Report:    Receptor Status:  Estrogen Receptor: 95%, positive, strong staining.             Progesterone Receptor: 95%, positive, strong staining.             HER2: Equivocal with IHC.  Negative with FISH, ratio 1.30.             Ki-67: 15%.   Did patient present with symptoms (if so, please note symptoms) or was this found on screening mammography?: routine screening mammography on 11/11/20 showing a possible abnormality in the right breast  Past/Anticipated interventions by surgeon, if any:  02/17/2021 RIGHT BREAST LUMPECTOMY WITH RADIOACTIVE SEED AND DEEP RIGHT AXILLARY SENTINEL LYMPH NODE BIOPSY Surgeon(s): Coralie Keens, MD  Past/Anticipated interventions by medical oncology, if any: Dr Jana Hakim good candidate for anti-estrogens. definitive decision regarding chemotherapy pending Oncotype  Lymphedema issues, if any:  no    Pain issues, if any:  no   SAFETY ISSUES: Prior radiation? yes, on forehead for skin cancer Pacemaker/ICD? no Possible current pregnancy?no, postmenopausal Is the patient on methotrexate? no  Current Complaints / other details:  none    Vitals:   03/17/21 1304  BP: 113/81  Pulse: 86  Resp: 18  Temp: 97.9 F (36.6 C)  TempSrc: Temporal  SpO2: 97%  Weight: 204 lb 12.8 oz (92.9 kg)  Height: _0  (1.727 m)

## 2021-03-12 ENCOUNTER — Encounter: Payer: Self-pay | Admitting: *Deleted

## 2021-03-13 ENCOUNTER — Encounter: Payer: Self-pay | Admitting: Physical Therapy

## 2021-03-13 ENCOUNTER — Ambulatory Visit: Payer: Medicare Other | Admitting: Physical Therapy

## 2021-03-13 ENCOUNTER — Other Ambulatory Visit: Payer: Self-pay

## 2021-03-13 DIAGNOSIS — C50411 Malignant neoplasm of upper-outer quadrant of right female breast: Secondary | ICD-10-CM | POA: Insufficient documentation

## 2021-03-13 DIAGNOSIS — M79601 Pain in right arm: Secondary | ICD-10-CM | POA: Insufficient documentation

## 2021-03-13 DIAGNOSIS — R600 Localized edema: Secondary | ICD-10-CM | POA: Insufficient documentation

## 2021-03-13 DIAGNOSIS — R293 Abnormal posture: Secondary | ICD-10-CM | POA: Insufficient documentation

## 2021-03-13 DIAGNOSIS — Z170 Estrogen receptor positive status [ER+]: Secondary | ICD-10-CM | POA: Insufficient documentation

## 2021-03-13 DIAGNOSIS — M25611 Stiffness of right shoulder, not elsewhere classified: Secondary | ICD-10-CM | POA: Insufficient documentation

## 2021-03-13 DIAGNOSIS — Z483 Aftercare following surgery for neoplasm: Secondary | ICD-10-CM | POA: Insufficient documentation

## 2021-03-13 DIAGNOSIS — Z510 Encounter for antineoplastic radiation therapy: Secondary | ICD-10-CM | POA: Diagnosis not present

## 2021-03-13 NOTE — Patient Instructions (Addendum)
            [facility]         [address], [place] [address]         [phone]         Annia Friendly, PT, CLT   After Breast Cancer Class It is recommended you attend the ABC class to be educated on lymphedema risk reduction. This class is free of charge and lasts for 1 hour. It is a 1-time class. You are scheduled for October 17th at 11:00. We will send you a link.  Scar massage You can begin gentle massage on your incisions using coconut oil a few minutes each day.  Compression garment You should be wearing your hugger bra with the compression foam I'll give you to help decrease swelling.  Home exercise Program Continue your stretches until you can do them without feeling tightness and add the ones below.  Follow up PT: It is recommended you return every 3 months for the first 3 years following surgery to be assessed on the SOZO machine for an L-Dex score. This helps prevent clinically significant lymphedema in 95% of patients. These follow up screens are 10 minute appointments that you are not billed for. You are scheduled for January 9th at 10:30.  MEDIAN NERVE: Mobilization XI    Stand with right palm flat on wall, fingers back, elbow bent, head tilted away. Sidestep away from wall, straightening elbow. Do _1__ sets of __5_ repetitions per session, holding 5 seconds. Do _2__ sessions per day.  Copyright  VHI. All rights reserved.  Closed Chain: Shoulder Abduction / Adduction - on Wall    One hand on wall, step to side and return. Stepping causes shoulder to abduct and adduct. Step _5__ times, holding 5 seconds, __2_ times per day.  http://ss.exer.us/267   Copyright  VHI. All rights reserved.  Closed Chain: Shoulder Flexion / Extension - on Wall    Hands on wall, step backward. Return. Stepping causes shoulder flexion and extension Do _5__ times, holding 5 seconds, __2_ times per day.  http://ss.exer.us/265    Copyright  VHI. All rights reserved.

## 2021-03-13 NOTE — Therapy (Signed)
[facility] @ [place], [place], [address] Phone:     Fax:     Physical Therapy Treatment  Patient Details  Name: Chelsea Lee MRN: [id number] Date of Birth: 1945-07-24 Referring Provider (PT): Dr. Coralie Keens   Encounter Date: 03/13/2021   PT End of Session - 03/13/21 1156     Visit Number 2    Number of Visits 10    Date for PT Re-Evaluation 04/10/21    PT Start Time 1105    PT Stop Time 1154    PT Time Calculation (min) 49 min    Activity Tolerance Patient tolerated treatment well    Behavior During Therapy [facility] for tasks assessed/performed             Past Medical History:  Diagnosis Date   Anxiety    Breast cancer ([place])    Cervical radiculopathy    CKD (chronic kidney disease)    stage 3   Hearing loss    HLD (hyperlipidemia)    Obesity    Osteoarthritis     Past Surgical History:  Procedure Laterality Date   BREAST BIOPSY Left    BREAST LUMPECTOMY WITH RADIOACTIVE SEED AND SENTINEL LYMPH NODE BIOPSY Right 02/17/2021   Procedure: RIGHT BREAST LUMPECTOMY WITH RADIOACTIVE SEED AND SENTINEL LYMPH NODE BIOPSY;  Surgeon: Coralie Keens, MD;  Location: [facility];  Service: General;  Laterality: Right;   FOOT SURGERY Right    MENISCUS REPAIR Right 06/08/2006    There were no vitals filed for this visit.   Subjective Assessment - 03/13/21 1106     Subjective Patient reports she underwent a right lumpectomy and sentinel node biopsy ( 3 negative nodes) on 02/17/2021. Oncotype score was low so no chemo is needed but she will meet with radiation oncology on 03/17/2021.    Pertinent History Patient was diagnosed on 11/11/2020 with right grade II-III invasive ductal carcinoma breast cancer. She underwent a right lumpectomy and sentinel node biopsy ( 3 negative nodes) on 02/17/2021.  It is ER/PR positive and HER2 negative with a Ki67 of 15%. She had a right foot fusion in 2019  due to osteoarthritis.    Patient Stated Goals See if my arm is doing ok    Currently in Pain? Yes    Pain Score 6     Pain Location Axilla    Pain Orientation Right    Pain Descriptors / Indicators Tingling;Other (Comment)   hypersensitivity   Pain Type Neuropathic pain    Pain Onset 1 to 4 weeks ago    Pain Frequency Intermittent    Aggravating Factors  Fabric touching it    Pain Relieving Factors Keeping arm by side    Multiple Pain Sites No                [facility] PT Assessment - 03/13/21 0001       Assessment   Medical Diagnosis s/p right lumpectomy and SLNB    Referring Provider (PT) Dr. Coralie Keens    Onset Date/Surgical Date 02/17/21    Hand Dominance Right    Prior Therapy Baselines      Precautions   Precautions Other (comment)    Precaution Comments recent surgery; right arm lymphedema risk      Restrictions   Weight Bearing Restrictions No      Balance Screen   Has the patient fallen in the past 6 months No  Has the patient had a decrease in activity level because of a fear of falling?  No    Is the patient reluctant to leave their home because of a fear of falling?  No      Home Ecologist residence    Living Arrangements Spouse/significant other    Available Help at Discharge Family      Prior Function   Level of [place] Retired    Leisure She does water aerobics 2x/week and a walks a few days per week but is limited by her right foot pain      Cognition   Overall Cognitive Status Within Functional Limits for tasks assessed      Observation/Other Assessments   Observations Right breast with significant seroma and hardness present just superior to her breast incision site. Both incisions are healing well with no redness present. Applied chip pack to area of swelling.      Posture/Postural Control   Posture/Postural Control Postural limitations    Postural Limitations Rounded  Shoulders;Forward head      ROM / Strength   AROM / PROM / Strength AROM      AROM   AROM Assessment Site Shoulder    Right/Left Shoulder Right    Right Shoulder Extension 44 Degrees    Right Shoulder Flexion 135 Degrees    Right Shoulder ABduction 153 Degrees    Right Shoulder Internal Rotation 62 Degrees    Right Shoulder External Rotation 77 Degrees      Strength   Overall Strength Within functional limits for tasks performed               LYMPHEDEMA/ONCOLOGY QUESTIONNAIRE - 03/13/21 0001       Type   Cancer Type Right breast cancer      Surgeries   Lumpectomy Date 02/17/21    Sentinel Lymph Node Biopsy Date 02/17/21    Number Lymph Nodes Removed 3      Treatment   Active Chemotherapy Treatment No    Past Chemotherapy Treatment No    Active Radiation Treatment No    Past Radiation Treatment No    Current Hormone Treatment No    Past Hormone Therapy No      What other symptoms do you have   Are you Having Heaviness or Tightness No    Are you having Pain Yes    Are you having pitting edema No    Is it Hard or Difficult finding clothes that fit No    Do you have infections No    Is there Decreased scar mobility Yes    Stemmer Sign No      Lymphedema Assessments   Lymphedema Assessments Upper extremities      Right Upper Extremity Lymphedema   10 cm Proximal to Olecranon Process 32.1 cm    Olecranon Process 26 cm    10 cm Proximal to Ulnar Styloid Process 21.8 cm    Just Proximal to Ulnar Styloid Process 15.8 cm    Across Hand at [employer] 20 cm    At [place] of 2nd Digit 6.8 cm                Quick Dash - 03/13/21 0001     Open a tight or new jar Mild difficulty    Do heavy household chores (wash walls, wash floors) No difficulty    Carry a shopping bag or briefcase No difficulty  Wash your back Mild difficulty    Use a knife to cut food No difficulty    Recreational activities in which you take some force or impact through your arm,  shoulder, or hand (golf, hammering, tennis) No difficulty    During the past week, to what extent has your arm, shoulder or hand problem interfered with your normal social activities with family, friends, neighbors, or groups? Not at all    During the past week, to what extent has your arm, shoulder or hand problem limited your work or other regular daily activities Not at all    Arm, shoulder, or hand pain. None    Tingling (pins and needles) in your arm, shoulder, or hand Moderate    Difficulty Sleeping Moderate difficulty    DASH Score 13.64 %                             PT Education - 03/13/21 1156     Education Details Aftercare; scar massage; HEP    Person(s) Educated Patient    Methods Explanation;Demonstration;Handout    Comprehension Returned demonstration;Verbalized understanding                 PT Long Term Goals - 03/13/21 1205       PT LONG TERM GOAL #1   Title Patient will demonstrate she has regained full shoulder ROM and function post operatively compared to baselines.    Time 4    Period Weeks    Status On-going    Target Date 04/10/21      PT LONG TERM GOAL #2   Title Patient will report >/= 25% less painin her right arm to tolerate daily tasks with greater ease.    Time 4    Period Weeks    Status New    Target Date 04/10/21      PT LONG TERM GOAL #3   Title Patient will report >/= 50% reduction in right breast edema.    Time 4    Period Weeks    Status New    Target Date 04/10/21      PT LONG TERM GOAL #4   Title Patient will improve her DASH score to be </= 4.55 for improved arm function.    Time 4    Period Weeks    Status New    Target Date 04/10/21      PT LONG TERM GOAL #5   Title Patient will increase right shoulder flexion to >/= 150 degrees and abduction to >/= 160 degrees for increased ease reaching.    Time 4    Period Weeks    Status New    Target Date 04/10/21                   Plan -  03/13/21 1156     Clinical Impression Statement Patient is doing well s/p right lumpectomy and sentinel node biopsy (3 negative nodes) on 02/17/2021. She will proceed with radiation next week. She has a fairly large seroma in her right breast but incisions appear to be healing well. Her shoulder ROM is somewhat limited but not significantly. She has neural tension in her right UE. She will benefit from PT to address right breast edema, neural tension and shoulder ROM limitations.    PT Treatment/Interventions ADLs/Self Care Home Management;Therapeutic exercise;Patient/family education;Manual techniques;Manual lymph drainage;Passive range of motion;Scar mobilization    PT Next Visit Plan Manual lymph  drainage right breast; PROM right shoulder and neural stretching    PT Home Exercise Plan Closed chain exercises; neural stretching    Consulted and Agree with Plan of Care Patient             Patient will benefit from skilled therapeutic intervention in order to improve the following deficits and impairments:  Postural dysfunction, Decreased range of motion, Impaired UE functional use, Pain, Decreased knowledge of precautions  Visit Diagnosis: Malignant neoplasm of upper-outer quadrant of right breast in female, estrogen receptor positive ([place]) - Plan: PT plan of care cert/re-cert  Abnormal posture - Plan: PT plan of care cert/re-cert  Aftercare following surgery for neoplasm - Plan: PT plan of care cert/re-cert  Localized edema - Plan: PT plan of care cert/re-cert  Stiffness of right shoulder, not elsewhere classified - Plan: PT plan of care cert/re-cert  Pain in right arm - Plan: PT plan of care cert/re-cert     Problem List Patient Active Problem List   Diagnosis Date Noted   Malignant neoplasm of upper-outer quadrant of right breast in female, estrogen receptor positive ([place]) 01/20/2021   Family history of Alzheimer's disease 02/21/2019   Primary osteoarthritis of right foot  03/09/2017   Annia Friendly, PT 03/13/21 12:11 PM   [facility] @ [place], [place], [address] Phone:     Fax:     Name: Chelsea Lee MRN: [id number] Date of Birth: [birth year]

## 2021-03-15 NOTE — Progress Notes (Signed)
Radiation Oncology         [phone] ________________________________  Name: Chelsea Lee MRN: [id number]  Date: 03/17/2021  DOB: 10-31-1945  Re-Evaluation Note  CC: Velna Hatchet, MD  Magrinat, Virgie Dad, MD    ICD-10-CM   1. Malignant neoplasm of upper-outer quadrant of right breast in female, estrogen receptor positive ([place])  C50.411    Z17.0       Diagnosis:   Stage IB (cT2, cN0, cM0) Right Breast UOQ, Invasive and in-situ Ductal Carcinoma, ER+ / PR+ / Her2-, Grade 2  Narrative:  The patient returns today to discuss radiation treatment options. She was seen in the multidisciplinary breast clinic on 01/22/21.   Pertinent imaging since then includes bilateral breast MRI on 01/31/21 which demonstrated the biopsy proven malignancy over the upper central right breast measuring 3.0 x 2.4 x 2.2 cm, and an indeterminate 2.1 cm non mass enhancement located in the middle third upper outer right breast. No suspicious mass or enhancement was seen within the left breast.  Right breast, upper outer biopsy performed on 02/06/21 revealed fibroadenomatoid and fibrocystic changes with calcifications.  She opted to proceed with right breast lumpectomy with sentinel node biopsies on 02/17/21 under the care of Dr. Ninfa Linden. Pathology from the procedure revealed: invasive ductal carcinoma measuring 3 cm, and ductal carcinoma in-situ with necrosis; margins not involved; IDC focally 0.2 cm from the medial margin and 0.3 cm from the lateral margin. 3/3 right axillary sentinel lymph nodes biopsied negative for carcinoma. Prognostic indicators significant for: ER and PR both 95% positive, and both with strong staining intensity; Her2 status negative; Ki67 15%; Grade 2.  Oncotype DX was obtained on the final surgical sample and the recurrence score of 12 predicts a risk of recurrence outside the breast over the next 9 years of 3%, if the patient's only systemic therapy is an antiestrogen for 5 years.   It also predicts no significant benefit from chemotherapy.   On review of systems, the patient reports feeling well. She denies pain within the right breast or swelling in the right arm or hand and any other symptoms.  she has developed some swelling superior to her lumpectomy scar and is being seen by physical therapy for this issue.  She denies any chills or fever.   Allergies:  has No Known Allergies.  Meds: Current Outpatient Medications  Medication Sig Dispense Refill   ALPRAZolam (XANAX) 1 MG tablet Take 1 tablet (1 mg total) by mouth 3 (three) times daily as needed for anxiety. 30 tablet 0   Apoaequorin (PREVAGEN PO) Take 1 tablet by mouth daily.     celecoxib (CELEBREX) 200 MG capsule Take 200 mg by mouth 2 (two) times daily.     gabapentin (NEURONTIN) 300 MG capsule TAKE 1 CAPSULE BY MOUTH 3  TIMES DAILY 270 capsule 3   lubiprostone (AMITIZA) 8 MCG capsule Take 1 capsule by mouth 2 (two) times daily.     simvastatin (ZOCOR) 40 MG tablet Take 1 tablet (40 mg total) by mouth at bedtime. 30 tablet    traMADol (ULTRAM) 50 MG tablet Take 1 tablet (50 mg total) by mouth every 6 (six) hours as needed. 25 tablet 0   No current facility-administered medications for this encounter.    Physical Findings: The patient is in no acute distress. Patient is alert and oriented.  height is _0  (1.727 m) and weight is 204 lb 12.8 oz (92.9 kg). Her temporal temperature is 97.9 F (36.6 C). Her blood  pressure is 113/81 and her pulse is 86. Her respiration is 18 and oxygen saturation is 97%.  Lungs are clear to auscultation bilaterally. Heart has regular rate and rhythm. No palpable cervical, supraclavicular, or axillary adenopathy. Abdomen soft, non-tender, normal bowel sounds. Right breast: no nipple discharge or bleeding.  She appears to have seroma superior to her lumpectomy cavity without signs of infection.  Patient has a well-healing scar in the upper outer quadrant.  She has a separate scar in  the axillary region which is also healing well.   Lab Findings: Lab Results  Component Value Date   WBC 6.3 01/22/2021   HGB 12.7 01/22/2021   HCT 39.2 01/22/2021   MCV 98.5 01/22/2021   PLT 151 01/22/2021    Radiographic Findings: NM Sentinel Node Inj-No Rpt (Breast)  Result Date: 02/17/2021 Sulfur Colloid was injected by the Nuclear Medicine Technologist for sentinel lymph node localization.   MM Breast Surgical Specimen  Result Date: 02/17/2021 CLINICAL DATA:  Status post right breast lumpectomy for breast cancer. EXAM: SPECIMEN RADIOGRAPH OF THE right BREAST COMPARISON:  Previous exam(s). FINDINGS: Status post excision of the right breast. The radioactive seed and coil biopsy marker clip are present, completely intact, and were marked for pathology. IMPRESSION: Specimen radiograph of the right breast. Electronically Signed   By: Abelardo Diesel M.D.   On: 02/17/2021 09:34   Impression:  Stage IB (cT2, cN0, cM0) Right Breast UOQ, Invasive and in-situ Ductal Carcinoma, ER+ / PR+ / Her2-, Grade 2  The patient would be excellent candidate for breast conservation with radiation therapy directed at the right breast area.  In light of the size of the lesion I would not recommend lumpectomy alone in this situation.  I discussed the general course of radiation therapy anticipated side effects and potential long-term toxicities of radiation therapy.  The patient appears to understand and wishes to proceed with planned course of treatment.  Plan:  Patient is scheduled for CT simulation later this week.  Treatments to begin the middle of next week.  She would appear to be a good candidate for hypofractionated accelerated radiation therapy over approximately 4 weeks.  -----------------------------------  Blair Promise, PhD, MD  This document serves as a record of services personally performed by Gery Pray, MD. It was created on his behalf by Roney Mans, a trained medical scribe. The  creation of this record is based on the scribe's personal observations and the provider's statements to them. This document has been checked and approved by the attending provider.

## 2021-03-17 ENCOUNTER — Ambulatory Visit
Admission: RE | Admit: 2021-03-17 | Discharge: 2021-03-17 | Disposition: A | Discharge status: Home / Self Care | Payer: Medicare Other | Source: Ambulatory Visit | Attending: Radiation Oncology | Admitting: Radiation Oncology

## 2021-03-17 ENCOUNTER — Other Ambulatory Visit: Payer: Self-pay

## 2021-03-17 ENCOUNTER — Encounter: Payer: Self-pay | Admitting: Radiation Oncology

## 2021-03-17 VITALS — BP 113/81 | HR 86 | Temp 97.9°F | Resp 18 | Ht 68.0 in | Wt 204.8 lb

## 2021-03-17 DIAGNOSIS — C50411 Malignant neoplasm of upper-outer quadrant of right female breast: Secondary | ICD-10-CM | POA: Insufficient documentation

## 2021-03-17 DIAGNOSIS — Z170 Estrogen receptor positive status [ER+]: Secondary | ICD-10-CM | POA: Diagnosis not present

## 2021-03-17 NOTE — Progress Notes (Signed)
See MD note for nursing evaluation. °

## 2021-03-19 ENCOUNTER — Ambulatory Visit
Admission: RE | Admit: 2021-03-19 | Discharge: 2021-03-19 | Disposition: A | Discharge status: Home / Self Care | Payer: Medicare Other | Source: Ambulatory Visit | Attending: Radiation Oncology | Admitting: Radiation Oncology

## 2021-03-19 DIAGNOSIS — C50411 Malignant neoplasm of upper-outer quadrant of right female breast: Secondary | ICD-10-CM | POA: Diagnosis not present

## 2021-03-19 DIAGNOSIS — M79601 Pain in right arm: Secondary | ICD-10-CM | POA: Insufficient documentation

## 2021-03-19 DIAGNOSIS — Z510 Encounter for antineoplastic radiation therapy: Secondary | ICD-10-CM | POA: Diagnosis not present

## 2021-03-19 DIAGNOSIS — R600 Localized edema: Secondary | ICD-10-CM | POA: Insufficient documentation

## 2021-03-19 DIAGNOSIS — Z170 Estrogen receptor positive status [ER+]: Secondary | ICD-10-CM | POA: Diagnosis not present

## 2021-03-19 DIAGNOSIS — M25611 Stiffness of right shoulder, not elsewhere classified: Secondary | ICD-10-CM | POA: Diagnosis not present

## 2021-03-19 DIAGNOSIS — R293 Abnormal posture: Secondary | ICD-10-CM | POA: Insufficient documentation

## 2021-03-19 DIAGNOSIS — Z483 Aftercare following surgery for neoplasm: Secondary | ICD-10-CM | POA: Insufficient documentation

## 2021-03-20 ENCOUNTER — Other Ambulatory Visit: Payer: Self-pay

## 2021-03-20 ENCOUNTER — Ambulatory Visit: Payer: Medicare Other

## 2021-03-20 DIAGNOSIS — R293 Abnormal posture: Secondary | ICD-10-CM

## 2021-03-20 DIAGNOSIS — M25611 Stiffness of right shoulder, not elsewhere classified: Secondary | ICD-10-CM | POA: Diagnosis not present

## 2021-03-20 DIAGNOSIS — M79601 Pain in right arm: Secondary | ICD-10-CM | POA: Diagnosis not present

## 2021-03-20 DIAGNOSIS — C50411 Malignant neoplasm of upper-outer quadrant of right female breast: Secondary | ICD-10-CM | POA: Diagnosis not present

## 2021-03-20 DIAGNOSIS — R600 Localized edema: Secondary | ICD-10-CM

## 2021-03-20 DIAGNOSIS — Z170 Estrogen receptor positive status [ER+]: Secondary | ICD-10-CM | POA: Diagnosis not present

## 2021-03-20 DIAGNOSIS — Z483 Aftercare following surgery for neoplasm: Secondary | ICD-10-CM

## 2021-03-20 DIAGNOSIS — Z510 Encounter for antineoplastic radiation therapy: Secondary | ICD-10-CM | POA: Diagnosis not present

## 2021-03-20 NOTE — Patient Instructions (Signed)

## 2021-03-20 NOTE — Therapy (Signed)
[facility] @ [place], [place], [address] Phone: [phone]   Fax:  [phone]  Physical Therapy Treatment  Patient Details  Name: Chelsea Lee MRN: [id number] Date of Birth: 12-Jul-1945 Referring Provider (PT): Dr. Coralie Keens   Encounter Date: 03/20/2021   PT End of Session - 03/20/21 0858     Visit Number 3    Number of Visits 10    Date for PT Re-Evaluation 04/10/21    PT Start Time 0806    PT Stop Time 0851    PT Time Calculation (min) 45 min    Activity Tolerance Patient tolerated treatment well    Behavior During Therapy [facility] for tasks assessed/performed             Past Medical History:  Diagnosis Date   Anxiety    Breast cancer ([place])    Cervical radiculopathy    CKD (chronic kidney disease)    stage 3   Hearing loss    HLD (hyperlipidemia)    Obesity    Osteoarthritis     Past Surgical History:  Procedure Laterality Date   BREAST BIOPSY Left    BREAST LUMPECTOMY WITH RADIOACTIVE SEED AND SENTINEL LYMPH NODE BIOPSY Right 02/17/2021   Procedure: RIGHT BREAST LUMPECTOMY WITH RADIOACTIVE SEED AND SENTINEL LYMPH NODE BIOPSY;  Surgeon: Coralie Keens, MD;  Location: [facility];  Service: General;  Laterality: Right;   FOOT SURGERY Right    MENISCUS REPAIR Right 06/08/2006    There were no vitals filed for this visit.   Subjective Assessment - 03/20/21 0805     Subjective I bought a new compression bra in a smaller size and I have been wearing the foam pack but I don't see alot of difference yet.  I had CT simulation yesterday and that went well.  I start a week from today.    Pertinent History Patient was diagnosed on 11/11/2020 with right grade II-III invasive ductal carcinoma breast cancer. She underwent a right lumpectomy and sentinel node biopsy ( 3 negative nodes) on 02/17/2021.  It is ER/PR positive and HER2 negative with a Ki67 of 15%. She had a right  foot fusion in 2019 due to osteoarthritis.    Patient Stated Goals See if my arm is doing ok    Currently in Pain? No/denies    Pain Score 0-No pain                               [facility] Adult PT Treatment/Exercise - 03/20/21 0001       Shoulder Exercises: Standing   Other Standing Exercises NTS on wall x 5      Shoulder Exercises: Pulleys   Flexion 2 minutes    Scaption 1 minute    ABduction 2 minutes      Shoulder Exercises: Therapy Ball   Flexion Both;10 reps    ABduction 5 reps      Manual Therapy   Myofascial Release to multiple areas of cording with arm at approx  90, and 120 to decrease axillary, upper arm and forearm cording    Passive ROM PROM right shoulder flex, scaption, abd                     PT Education - 03/20/21 0857     Education Details cording, HEP,    Person(s) Educated Patient    Methods  Explanation    Comprehension Verbalized understanding                 PT Long Term Goals - 03/13/21 1205       PT LONG TERM GOAL #1   Title Patient will demonstrate she has regained full shoulder ROM and function post operatively compared to baselines.    Time 4    Period Weeks    Status On-going    Target Date 04/10/21      PT LONG TERM GOAL #2   Title Patient will report >/= 25% less painin her right arm to tolerate daily tasks with greater ease.    Time 4    Period Weeks    Status New    Target Date 04/10/21      PT LONG TERM GOAL #3   Title Patient will report >/= 50% reduction in right breast edema.    Time 4    Period Weeks    Status New    Target Date 04/10/21      PT LONG TERM GOAL #4   Title Patient will improve her DASH score to be </= 4.55 for improved arm function.    Time 4    Period Weeks    Status New    Target Date 04/10/21      PT LONG TERM GOAL #5   Title Patient will increase right shoulder flexion to >/= 150 degrees and abduction to >/= 160 degrees for increased ease reaching.    Time 4     Period Weeks    Status New    Target Date 04/10/21                   Plan - 03/20/21 0858     Clinical Impression Statement Pt performed AA ROM exs and NTS followed by MFR for multiple cords palpable in axillary, upper arm and foream.  PROM performed to right shoulder.  ROM improved after treatment.  Pt educated about cordig and given handout    Stability/Clinical Decision Making Stable/Uncomplicated    Rehab Potential Excellent    PT Frequency 2x / week    PT Duration 4 weeks    PT Treatment/Interventions ADLs/Self Care Home Management;Therapeutic exercise;Patient/family education;Manual techniques;Manual lymph drainage;Passive range of motion;Scar mobilization    PT Next Visit Plan initiate Manual lymph drainage right breast; PROM right shoulder and neural stretching, MFR cording    PT Home Exercise Plan Closed chain exercises; neural stretching    Consulted and Agree with Plan of Care Patient             Patient will benefit from skilled therapeutic intervention in order to improve the following deficits and impairments:  Postural dysfunction, Decreased range of motion, Impaired UE functional use, Pain, Decreased knowledge of precautions  Visit Diagnosis: Stiffness of right shoulder, not elsewhere classified  Pain in right arm  Localized edema  Aftercare following surgery for neoplasm  Abnormal posture  Malignant neoplasm of upper-outer quadrant of right breast in female, estrogen receptor positive ([place])     Problem List Patient Active Problem List   Diagnosis Date Noted   Malignant neoplasm of upper-outer quadrant of right breast in female, estrogen receptor positive ([place]) 01/20/2021   Family history of Alzheimer's disease 02/21/2019   Primary osteoarthritis of right foot 03/09/2017    Claris Pong, PT 03/20/2021, 9:10 AM  [facility] @ [place], [place], [address] Phone:  [phone]   Fax:  [phone]  Name: HANEEN BERNALES MRN: [id number] Date of Birth: 06-Nov-1945

## 2021-03-21 DIAGNOSIS — Z23 Encounter for immunization: Secondary | ICD-10-CM | POA: Diagnosis not present

## 2021-03-24 ENCOUNTER — Other Ambulatory Visit: Payer: Self-pay

## 2021-03-24 ENCOUNTER — Ambulatory Visit: Payer: Medicare Other

## 2021-03-24 DIAGNOSIS — M25611 Stiffness of right shoulder, not elsewhere classified: Secondary | ICD-10-CM

## 2021-03-24 DIAGNOSIS — Z170 Estrogen receptor positive status [ER+]: Secondary | ICD-10-CM

## 2021-03-24 DIAGNOSIS — R600 Localized edema: Secondary | ICD-10-CM | POA: Diagnosis not present

## 2021-03-24 DIAGNOSIS — Z510 Encounter for antineoplastic radiation therapy: Secondary | ICD-10-CM | POA: Diagnosis not present

## 2021-03-24 DIAGNOSIS — Z483 Aftercare following surgery for neoplasm: Secondary | ICD-10-CM

## 2021-03-24 DIAGNOSIS — C50411 Malignant neoplasm of upper-outer quadrant of right female breast: Secondary | ICD-10-CM | POA: Diagnosis not present

## 2021-03-24 DIAGNOSIS — M79601 Pain in right arm: Secondary | ICD-10-CM

## 2021-03-24 DIAGNOSIS — R293 Abnormal posture: Secondary | ICD-10-CM

## 2021-03-24 NOTE — Patient Instructions (Signed)
Self manual lymph drainage:      [facility]cer Rehab [phone] Perform this sequence once a day.  Only give enough pressure to your skin to make the skin move.  Hug yourself.  Do circles at your neck just above your collarbones.  Repeat this 10 times.  Diaphragmatic - Supine   Inhale through nose making navel move out toward hands. Exhale through puckered lips, hands follow navel in. Repeat _5__ times. Rest _10__ seconds between repeats.  Axilla - One at a Time   Using full weight of flat hand and fingers at center of uninvolved armpit, make _10__ in-place circles.   Copyright  VHI. All rights reserved.  LEG: Inguinal Nodes Stimulation   With small finger side of hand against hip crease on involved side, gently perform circles at the crease. Repeat __10_ times.   Axilla to Inguinal Nodes - Pump   On involved side, pump _4__ times from armpit along side of trunk to hip crease.  Now gently stretch skin from the involved side to the uninvolved side across the chest at the shoulder line.  Repeat that 4 times.  Draw an imaginary diagonal line from upper outer breast through the nipple area toward lower inner breast.  Direct fluid upward and inward from this line toward the pathway across your upper chest .  Do this in three rows to treat all of the upper inner breast tissue, and do each row 3-4x.      Direct fluid to treat all of lower outer breast tissue downward and outward toward      pathway that is aimed at the left groin.  Finish by doing the pathways as described above going from your involved armpit to the same side groin and going across your upper chest from the involved shoulder to the uninvolved shoulder.  Repeat the steps above where you do circles in your left groin and right armpit. Copyright  VHI. All rights reserved.

## 2021-03-24 NOTE — Therapy (Signed)
[facility] @ [place], [place], [address] Phone: [phone]   Fax:  [phone]  Physical Therapy Treatment  Patient Details  Name: Chelsea Lee MRN: [id number] Date of Birth: [birth year] Referring Provider (PT): Dr. Coralie Keens   Encounter Date: 03/24/2021   PT End of Session - 03/24/21 0916     Visit Number 4    Number of Visits 10    Date for PT Re-Evaluation 04/10/21    PT Start Time 0805    PT Stop Time 0907    PT Time Calculation (min) 62 min    Activity Tolerance Patient tolerated treatment well    Behavior During Therapy [facility] for tasks assessed/performed             Past Medical History:  Diagnosis Date   Anxiety    Breast cancer ([place])    Cervical radiculopathy    CKD (chronic kidney disease)    stage 3   Hearing loss    HLD (hyperlipidemia)    Obesity    Osteoarthritis     Past Surgical History:  Procedure Laterality Date   BREAST BIOPSY Left    BREAST LUMPECTOMY WITH RADIOACTIVE SEED AND SENTINEL LYMPH NODE BIOPSY Right 02/17/2021   Procedure: RIGHT BREAST LUMPECTOMY WITH RADIOACTIVE SEED AND SENTINEL LYMPH NODE BIOPSY;  Surgeon: Coralie Keens, MD;  Location: [facility];  Service: General;  Laterality: Right;   FOOT SURGERY Right    MENISCUS REPAIR Right 06/08/2006    There were no vitals filed for this visit.   Subjective Assessment - 03/24/21 0810     Subjective I've been wearing the chip pack that Inez Catalina gave me and I think that's been helping my breast some. My cording seems to be all but resolved so I'm happy about that.    Pertinent History Patient was diagnosed on 11/11/2020 with right grade II-III invasive ductal carcinoma breast cancer. She underwent a right lumpectomy and sentinel node biopsy ( 3 negative nodes) on 02/17/2021.  It is ER/PR positive and HER2 negative with a Ki67 of 15%. She had a right foot fusion in 2019 due to osteoarthritis.     Patient Stated Goals See if my arm is doing ok                               [facility] Adult PT Treatment/Exercise - 03/24/21 0001       Manual Therapy   Manual Therapy Manual Lymphatic Drainage (MLD)    Myofascial Release to multiple areas of cording during P/ROM at the upper arm and forearm where cording palpable    Manual Lymphatic Drainage (MLD) In Supine: Short neck, superficial and deep abdominals, Rt inguinal and Lt axillary nodes, Lt intact upper quadrant sequence, anterior inter-axillary and Rt axillo-inguinal anastomosis, then focused on Rt breast beginning to instruct pt throughout    Passive ROM PROM right shoulder flex, scaption, and D2 to pts tolerance                     PT Education - 03/24/21 0906     Education Details Self MLD    Person(s) Educated Patient    Methods Explanation;Demonstration;Handout    Comprehension Verbalized understanding;Returned demonstration;Tactile cues required;Need further instruction                 PT Long Term Goals - 03/13/21 1205  PT LONG TERM GOAL #1   Title Patient will demonstrate she has regained full shoulder ROM and function post operatively compared to baselines.    Time 4    Period Weeks    Status On-going    Target Date 04/10/21      PT LONG TERM GOAL #2   Title Patient will report >/= 25% less painin her right arm to tolerate daily tasks with greater ease.    Time 4    Period Weeks    Status New    Target Date 04/10/21      PT LONG TERM GOAL #3   Title Patient will report >/= 50% reduction in right breast edema.    Time 4    Period Weeks    Status New    Target Date 04/10/21      PT LONG TERM GOAL #4   Title Patient will improve her DASH score to be </= 4.55 for improved arm function.    Time 4    Period Weeks    Status New    Target Date 04/10/21      PT LONG TERM GOAL #5   Title Patient will increase right shoulder flexion to >/= 150 degrees and abduction to  >/= 160 degrees for increased ease reaching.    Time 4    Period Weeks    Status New    Target Date 04/10/21                   Plan - 03/24/21 0916     Clinical Impression Statement Continued with MFR to cordinga nd P/ROM of Rt shoulder but also added manual lymph drainage to Rt breast today as well. Educated pt in basics of anatomy of lymphatic system and basic principles of MLD while performing. Also began instructing pt in this while having her return demo of each step. She required hand over hand pressure for correct stretch, not slide, technique and for lighter pressure. She was able to retrun a better demo after instruction but will benefit from further review of this. Softening was noted by pt and therapist at superio aspect of breast at end of session as was her P/ROM improved as well. Issued handout for pt to begin trying this at home as she returns tomorrow for another appt. She begins radiation this week so encouraged her to try to perfomr self MLD 2x/day for the next 4 weeks of her treatment. She verbalized good understanding.    Stability/Clinical Decision Making Stable/Uncomplicated    Rehab Potential Excellent    PT Frequency 2x / week    PT Duration 4 weeks    PT Treatment/Interventions ADLs/Self Care Home Management;Therapeutic exercise;Patient/family education;Manual techniques;Manual lymph drainage;Passive range of motion;Scar mobilization    PT Next Visit Plan Cont and review with pt Manual lymph drainage of right breast reassissng her technique; cont PROM right shoulder and neural stretching (encourage pt to cont this during radiation), MFR cording    PT Home Exercise Plan Closed chain exercises; neural stretching    Consulted and Agree with Plan of Care Patient             Patient will benefit from skilled therapeutic intervention in order to improve the following deficits and impairments:  Postural dysfunction, Decreased range of motion, Impaired UE  functional use, Pain, Decreased knowledge of precautions  Visit Diagnosis: Stiffness of right shoulder, not elsewhere classified  Pain in right arm  Localized edema  Aftercare following surgery for neoplasm  Abnormal posture  Malignant neoplasm of upper-outer quadrant of right breast in female, estrogen receptor positive [facility])     Problem List Patient Active Problem List   Diagnosis Date Noted   Malignant neoplasm of upper-outer quadrant of right breast in female, estrogen receptor positive ([place]) 01/20/2021   Family history of Alzheimer's disease 02/21/2019   Primary osteoarthritis of right foot 03/09/2017    Otelia Limes, PTA 03/24/2021, 9:24 AM  [facility] @ [place], [place], [address] Phone: [phone]   Fax:  [phone]  Name: SAARA KIJOWSKI MRN: [id number] Date of Birth: [birth year]

## 2021-03-25 ENCOUNTER — Encounter: Payer: Self-pay | Admitting: *Deleted

## 2021-03-25 ENCOUNTER — Ambulatory Visit: Payer: Medicare Other

## 2021-03-25 DIAGNOSIS — R600 Localized edema: Secondary | ICD-10-CM

## 2021-03-25 DIAGNOSIS — M79601 Pain in right arm: Secondary | ICD-10-CM | POA: Diagnosis not present

## 2021-03-25 DIAGNOSIS — R293 Abnormal posture: Secondary | ICD-10-CM

## 2021-03-25 DIAGNOSIS — M25611 Stiffness of right shoulder, not elsewhere classified: Secondary | ICD-10-CM

## 2021-03-25 DIAGNOSIS — Z170 Estrogen receptor positive status [ER+]: Secondary | ICD-10-CM

## 2021-03-25 DIAGNOSIS — C50411 Malignant neoplasm of upper-outer quadrant of right female breast: Secondary | ICD-10-CM | POA: Diagnosis not present

## 2021-03-25 DIAGNOSIS — Z510 Encounter for antineoplastic radiation therapy: Secondary | ICD-10-CM | POA: Diagnosis not present

## 2021-03-25 DIAGNOSIS — Z483 Aftercare following surgery for neoplasm: Secondary | ICD-10-CM

## 2021-03-25 NOTE — Therapy (Signed)
[facility] @ [place], [place], [address] Phone: [phone]   Fax:  [phone]  Physical Therapy Treatment  Patient Details  Name: Chelsea Lee MRN: [id number] Date of Birth: 1945/09/19 Referring Provider (PT): Dr. Coralie Keens   Encounter Date: 03/25/2021   PT End of Session - 03/25/21 1416     Visit Number 5    Number of Visits 10    Date for PT Re-Evaluation 04/10/21    PT Start Time 0488    PT Stop Time 1500    PT Time Calculation (min) 55 min    Activity Tolerance Patient tolerated treatment well    Behavior During Therapy [facility] for tasks assessed/performed             Past Medical History:  Diagnosis Date   Anxiety    Breast cancer ([place])    Cervical radiculopathy    CKD (chronic kidney disease)    stage 3   Hearing loss    HLD (hyperlipidemia)    Obesity    Osteoarthritis     Past Surgical History:  Procedure Laterality Date   BREAST BIOPSY Left    BREAST LUMPECTOMY WITH RADIOACTIVE SEED AND SENTINEL LYMPH NODE BIOPSY Right 02/17/2021   Procedure: RIGHT BREAST LUMPECTOMY WITH RADIOACTIVE SEED AND SENTINEL LYMPH NODE BIOPSY;  Surgeon: Coralie Keens, MD;  Location: [facility];  Service: General;  Laterality: Right;   FOOT SURGERY Right    MENISCUS REPAIR Right 06/08/2006    There were no vitals filed for this visit.   Subjective Assessment - 03/25/21 1406     Subjective I wasn't able to get into the ABC class.I tried some of the MLD at home.  I didn't feel like I was doing anything.  I think the foam pack has helped the upper breast.    Pertinent History Patient was diagnosed on 11/11/2020 with right grade II-III invasive ductal carcinoma breast cancer. She underwent a right lumpectomy and sentinel node biopsy ( 3 negative nodes) on 02/17/2021.  It is ER/PR positive and HER2 negative with a Ki67 of 15%. She had a right foot fusion in 2019 due to  osteoarthritis.    Patient Stated Goals See if my arm is doing ok    Currently in Pain? No/denies    Pain Score 0-No pain    Multiple Pain Sites No                               [facility] Adult PT Treatment/Exercise - 03/25/21 0001       Shoulder Exercises: Pulleys   Flexion 2 minutes    ABduction 2 minutes      Manual Therapy   Manual Therapy Myofascial release;Passive ROM;Manual Lymphatic Drainage (MLD)    Myofascial Release to multiple areas of cording during P/ROM at the upper arm and forearm where cording palpable    Manual Lymphatic Drainage (MLD) In Supine: pt instructed in Short neck, 5 diaphragmatic breaths, Rt inguinal and Lt axillary nodes, , anterior inter-axillary and Rt axillo-inguinal anastomosis, then discussed  Rt breast.  Pt performed all LN, and pathways multiple times, but did not have time to focus on breast.  New handout given with breast diagram    Passive ROM PROM right shoulder flex, scaption, and D2 to pts tolerance  PT Education - 03/25/21 1721     Education Details Self MLD    Person(s) Educated Patient    Methods Explanation;Demonstration;Handout    Comprehension Verbalized understanding;Returned demonstration;Verbal cues required;Tactile cues required;Need further instruction                 PT Long Term Goals - 03/13/21 1205       PT LONG TERM GOAL #1   Title Patient will demonstrate she has regained full shoulder ROM and function post operatively compared to baselines.    Time 4    Period Weeks    Status On-going    Target Date 04/10/21      PT LONG TERM GOAL #2   Title Patient will report >/= 25% less painin her right arm to tolerate daily tasks with greater ease.    Time 4    Period Weeks    Status New    Target Date 04/10/21      PT LONG TERM GOAL #3   Title Patient will report >/= 50% reduction in right breast edema.    Time 4    Period Weeks    Status New    Target Date  04/10/21      PT LONG TERM GOAL #4   Title Patient will improve her DASH score to be </= 4.55 for improved arm function.    Time 4    Period Weeks    Status New    Target Date 04/10/21      PT LONG TERM GOAL #5   Title Patient will increase right shoulder flexion to >/= 150 degrees and abduction to >/= 160 degrees for increased ease reaching.    Time 4    Period Weeks    Status New    Target Date 04/10/21                   Plan - 03/25/21 1416     Clinical Impression Statement Continued with MFR to area of cording which is greatly improved, and PROM.  Had pt perform Self  breast MLD to supraclavicular and LN's, diaphragmatic breathing and pathways and practiced repeatedly.  We had very limited time to work on the breast.  Showed pt pictures of lymphatics and pathways and explained.  She asked for a different handout and was given the body diagram/breast MLD handout with written instructions    Stability/Clinical Decision Making Stable/Uncomplicated    Rehab Potential Excellent    PT Frequency 2x / week    PT Duration 4 weeks    PT Treatment/Interventions ADLs/Self Care Home Management;Therapeutic exercise;Patient/family education;Manual techniques;Manual lymph drainage;Passive range of motion;Scar mobilization    PT Next Visit Plan Cont and review with pt Manual lymph drainage of right breast reassissng her technique; cont PROM right shoulder and neural stretching (encourage pt to cont this during radiation), MFR cording    PT Home Exercise Plan Closed chain exercises; neural stretching    Consulted and Agree with Plan of Care Patient             Patient will benefit from skilled therapeutic intervention in order to improve the following deficits and impairments:  Postural dysfunction, Decreased range of motion, Impaired UE functional use, Pain, Decreased knowledge of precautions  Visit Diagnosis: Stiffness of right shoulder, not elsewhere classified  Pain in right  arm  Localized edema  Aftercare following surgery for neoplasm  Abnormal posture  Malignant neoplasm of upper-outer quadrant of right breast in female, estrogen receptor  positive [facility])     Problem List Patient Active Problem List   Diagnosis Date Noted   Malignant neoplasm of upper-outer quadrant of right breast in female, estrogen receptor positive ([place]) 01/20/2021   Family history of Alzheimer's disease 02/21/2019   Primary osteoarthritis of right foot 03/09/2017    Claris Pong, PT 03/25/2021, 5:25 PM  [facility] @ [place], [place], [address] Phone: [phone]   Fax:  [phone]  Name: Chelsea Lee MRN: [id number] Date of Birth: Jun 13, 1945

## 2021-03-25 NOTE — Patient Instructions (Signed)
Pt was given Breast MLD written handout with illustration at her request because she found the other difficult to follow.

## 2021-03-26 ENCOUNTER — Inpatient Hospital Stay: Payer: Medicare Other | Attending: Oncology | Admitting: Oncology

## 2021-03-26 ENCOUNTER — Other Ambulatory Visit: Payer: Self-pay

## 2021-03-26 VITALS — BP 154/67 | HR 70 | Temp 97.4°F | Resp 18 | Ht 68.0 in | Wt 204.6 lb

## 2021-03-26 DIAGNOSIS — M8008XA Age-related osteoporosis with current pathological fracture, vertebra(e), initial encounter for fracture: Secondary | ICD-10-CM

## 2021-03-26 DIAGNOSIS — Z79811 Long term (current) use of aromatase inhibitors: Secondary | ICD-10-CM | POA: Insufficient documentation

## 2021-03-26 DIAGNOSIS — C50411 Malignant neoplasm of upper-outer quadrant of right female breast: Secondary | ICD-10-CM | POA: Insufficient documentation

## 2021-03-26 DIAGNOSIS — Z170 Estrogen receptor positive status [ER+]: Secondary | ICD-10-CM | POA: Insufficient documentation

## 2021-03-26 MED ORDER — ANASTROZOLE 1 MG PO TABS: 1.0000 mg | ORAL_TABLET | Freq: Every day | ORAL | 4 refills | Status: DC

## 2021-03-26 NOTE — Progress Notes (Signed)
[facility]  [phone] Fax:([phone]) [phone]     ID: BETSIE PECKMAN DOB: 1946/02/18  MR#: [id number]  VZD#:[id number]  Patient Care Team: Velna Hatchet, MD as PCP - General (Internal Medicine) Crist Infante, MD as Consulting Physician (Internal Medicine) Mauro Kaufmann, RN as Oncology Nurse Navigator Rockwell Germany, RN as Oncology Nurse Navigator Coralie Keens, MD as Consulting Physician (General Surgery) Naylin Burkle, Virgie Dad, MD as Consulting Physician (Oncology) Gery Pray, MD as Consulting Physician (Radiation Oncology) Harriett Sine, MD as Consulting Physician (Dermatology) Melrose Nakayama, MD as Consulting Physician (Orthopedic Surgery) Tyler Pita, MD as Referring Physician (Otolaryngology) Chauncey Cruel, MD OTHER MD:  CHIEF COMPLAINT: Estrogen receptor positive breast cancer  CURRENT TREATMENT: Awaiting definitive surgery   INTERVAL HISTORY: Nimisha returns today for follow-up of her breast cancer accompanied by her husband [name].  Since her last visit here she underwent biopsy of a second area in the upper outer quadrant 02/06/2021.  This showed only fibroadenomatoid changes, no evidence of malignancy ([id number]).  On 02/17/2021 Katharine Look underwent right lumpectomy and sentinel lymph node sampling.  The final pathology ([id number]) showed an invasive ductal carcinoma measuring 3.0 cm, grade 2, with negative margins.  A total of 3 right axillary sentinel lymph nodes were removed, all clear.  The prognostic panel was not repeated.  She is scheduled for 4 weeks of adjuvant radiation beginning 03/27/2021  REVIEW OF SYSTEMS: Caytlyn did well with the surgery, with no unusual fever, bleeding, or pain.  She is pleased with the cosmetic result.  She tells me her right breast has always been larger than the left.  She is exercising by doing water aerobics and some walking.  Detailed review of systems was otherwise stable   COVID 19  VACCINATION STATUS: Pfizer x4 as of August 2022   HISTORY OF CURRENT ILLNESS: IVY PURYEAR had routine screening mammography on 11/11/2020 showing a possible abnormality in the bilateral breasts. She underwent bilateral diagnostic mammography with tomography and right breast ultrasonography at [facility] on 01/08/2021 showing: breast density category C; 2.6 cm posterior upper-outer right breast mass; no abnormal right axillary lymph nodes; more likely benign complex cystic changes within inner right breast; benign left breast calcifications.  Accordingly on 01/13/2021 she proceeded to biopsy of the right breast area in question. The pathology from the 11 o'clock mass ([id number]) showed: invasive ductal carcinoma, grade 2-3; ductal carcinoma in situ with necrosis. Prognostic indicators significant for: estrogen receptor, >95% positive and progesterone receptor, >95% positive, both with strong staining intensity. Proliferation marker Ki67 at 15%. HER2 equivocal by immunohistochemistry (2+), but negative by fluorescent in situ hybridization with a signals ratio 1.3 and number per cell 1.75.  The area in the right breast at 2:30 was biopsied at the same time showing fibrocystic change with apocrine metaplasia.  Cancer Staging Malignant neoplasm of upper-outer quadrant of right breast in female, estrogen receptor positive ([place]) Staging form: Breast, AJCC 8th Edition - Clinical stage from 01/22/2021: Stage IB (cT2, cN0, cM0, G2, ER+, PR+, HER2-) - Signed by Chauncey Cruel, MD on 01/22/2021 Stage prefix: Initial diagnosis Histologic grading system: 3 grade system  The patient's subsequent history is as detailed below.    PAST MEDICAL HISTORY: Past Medical History:  Diagnosis Date   Anxiety    Breast cancer ([place])    Cervical radiculopathy    CKD (chronic kidney disease)    stage 3   Hearing loss    HLD (hyperlipidemia)  Obesity    Osteoarthritis   She reports a history of skin  cancer, treated with 19 fractions of radiation therapy while living in [place] (Dr. Juanito Doom)   PAST SURGICAL HISTORY: Past Surgical History:  Procedure Laterality Date   BREAST BIOPSY Left    BREAST LUMPECTOMY WITH RADIOACTIVE SEED AND SENTINEL LYMPH NODE BIOPSY Right 02/17/2021   Procedure: RIGHT BREAST LUMPECTOMY WITH RADIOACTIVE SEED AND SENTINEL LYMPH NODE BIOPSY;  Surgeon: Coralie Keens, MD;  Location: [facility];  Service: General;  Laterality: Right;   FOOT SURGERY Right    MENISCUS REPAIR Right 06/08/2006    FAMILY HISTORY: Family History  Problem Relation Age of Onset   Diabetes Mother    Dementia Mother    Heart disease Father        age 58 death   Alcohol abuse Father    Her father died at age 24 from [place]. Her mother died at age 58. Cristle has three brothers (and no sisters). There is no family history of cancer to her knowledge.   GYNECOLOGIC HISTORY:  No LMP recorded. Patient is postmenopausal. Menarche: 75 years old Age at first live birth: 75 years old [place] P 2 LMP date unsure Contraceptive: used from age "73 to ?"  (Several years) HRT never used  Hysterectomy? no BSO? 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 Husband [name] is a Clinical biochemist. She lives at home with [name]; they have no pets. Son Damascus, age 68, is self employed and lives in [place], [place]. Daughter [name]," age 10, is a Doctor, hospital for [employer] in [place], [place]. Karrine attends [employer].    ADVANCED DIRECTIVES: In the absence of any documentation to the contrary, the patient's spouse is their HCPOA.    HEALTH MAINTENANCE: Social History   Tobacco Use   Smoking status: Never   Smokeless tobacco: Never  Substance Use Topics   Alcohol use: Yes    Alcohol/week: 0.0 standard drinks    Comment: social   Drug use: No     Colonoscopy: yes, done in [place]  PAP: date  unsure  Bone density: date unsure   No Known Allergies  Current Outpatient Medications  Medication Sig Dispense Refill   anastrozole (ARIMIDEX) 1 MG tablet Take 1 tablet (1 mg total) by mouth daily. Start May 08, 2021 90 tablet 4   cholecalciferol (VITAMIN D3) 25 MCG (1000 UNIT) tablet Take 1 tablet (1,000 Units total) by mouth daily.     ALPRAZolam (XANAX) 1 MG tablet Take 1 tablet (1 mg total) by mouth 3 (three) times daily as needed for anxiety. 30 tablet 0   Apoaequorin (PREVAGEN PO) Take 1 tablet by mouth daily.     celecoxib (CELEBREX) 200 MG capsule Take 200 mg by mouth 2 (two) times daily.     gabapentin (NEURONTIN) 300 MG capsule Take 1 capsule (300 mg total) by mouth 2 (two) times daily. 270 capsule 3   lubiprostone (AMITIZA) 8 MCG capsule Take 1 capsule by mouth 2 (two) times daily.     simvastatin (ZOCOR) 40 MG tablet Take 1 tablet (40 mg total) by mouth at bedtime. 30 tablet    No current facility-administered medications for this visit.    OBJECTIVE: White woman in no acute distress  Vitals:   03/26/21 0845  BP: (!) 154/67  Pulse: 70  Resp: 18  Temp: (!) 97.4 F (36.3 C)  SpO2: 99%  Body mass index is 31.11 kg/m.   Wt Readings from Last 3 Encounters:  03/26/21 204 lb 9.6 oz (92.8 kg)  03/17/21 204 lb 12.8 oz (92.9 kg)  02/17/21 205 lb 14.6 oz (93.4 kg)      ECOG FS:1 - Symptomatic but completely ambulatory  Sclerae unicteric, EOMs intact Wearing a mask No cervical or supraclavicular adenopathy Lungs no rales or rhonchi Heart regular rate and rhythm Abd soft, nontender, positive bowel sounds MSK no focal spinal tenderness, no upper extremity lymphedema Neuro: nonfocal, well oriented, appropriate affect Breasts: The right breast is status post recent lumpectomy.  The cosmetic result is good.  The incisions are healing nicely with no dehiscence erythema or swelling.  The left breast and both axillae are benign.  LAB RESULTS:  CMP     Component  Value Date/Time   NA 142 01/22/2021 0825   K 4.4 01/22/2021 0825   CL 109 01/22/2021 0825   CO2 22 01/22/2021 0825   GLUCOSE 160 (H) 01/22/2021 0825   BUN 24 (H) 01/22/2021 0825   CREATININE 1.25 (H) 01/22/2021 0825   CALCIUM 9.0 01/22/2021 0825   PROT 6.5 01/22/2021 0825   ALBUMIN 3.9 01/22/2021 0825   AST 25 01/22/2021 0825   ALT 29 01/22/2021 0825   ALKPHOS 99 01/22/2021 0825   BILITOT 0.5 01/22/2021 0825   GFRNONAA 44 (L) 01/22/2021 0825    No results found for: TOTALPROTELP, ALBUMINELP, A1GS, A2GS, BETS, BETA2SER, GAMS, MSPIKE, SPEI  Lab Results  Component Value Date   WBC 6.3 01/22/2021   NEUTROABS 3.2 01/22/2021   HGB 12.7 01/22/2021   HCT 39.2 01/22/2021   MCV 98.5 01/22/2021   PLT 151 01/22/2021    No results found for: LABCA2  No components found for: IRJJOA416  No results for input(s): INR in the last 168 hours.  No results found for: LABCA2  No results found for: SAY301  No results found for: SWF093  No results found for: ATF573  No results found for: CA2729  No components found for: HGQUANT  No results found for: CEA1 / No results found for: CEA1   No results found for: AFPTUMOR  No results found for: CHROMOGRNA  No results found for: KPAFRELGTCHN, LAMBDASER, KAPLAMBRATIO (kappa/lambda light chains)  No results found for: HGBA, HGBA2QUANT, HGBFQUANT, HGBSQUAN (Hemoglobinopathy evaluation)   No results found for: LDH  No results found for: IRON, TIBC, IRONPCTSAT (Iron and TIBC)  No results found for: FERRITIN  Urinalysis No results found for: COLORURINE, APPEARANCEUR, LABSPEC, PHURINE, GLUCOSEU, HGBUR, BILIRUBINUR, KETONESUR, PROTEINUR, UROBILINOGEN, NITRITE, LEUKOCYTESUR   STUDIES: No results found.   ELIGIBLE FOR AVAILABLE RESEARCH PROTOCOL: no  ASSESSMENT: 75 y.o. [place] woman status post right breast upper outer quadrant biopsy 01/13/2021 for a clinical T2 N0, stage IB-IIA invasive ductal carcinoma, grade 2/3, estrogen  and progesterone receptor positive, HER2 not amplified, with an MIB-1 of 15%  (A) biopsy of a separate area in the right breast upper outer quadrant 02/06/2021 showed no evidence of malignancy.  (1) right lumpectomy and sentinel lymph node sampling on 02/17/2021 showed a pT2 pN0, stage IB invasive ductal carcinoma, grade 2, with negative margins  (A) a total of 3 right axillary sentinel lymph nodes were removed.  (2) Oncotype score of 12 predicted a risk of recurrence outside the breast within the next 9 years of 3% if the patient's only systemic therapy is antiestrogens for 5 years.  It also predicted no significant benefit from chemotherapy.  (3) adjuvant radiation 03/27/2021 through  04/24/2021  (4) antiestrogens to follow the completion of local treatment  (A) bone density 05/19/2005-- repeat Oct 2022  (B) used oral contraceptives several years with no complications  PLAN: I reviewed Kenda's situation with her today.  She has a good understanding of her surgical results and her stage.  We reviewed the Oncotype results and she understands why she does not need to chemotherapy to have a good prognosis.  She understands that to obtain that good prognosis she will need to take an antiestrogen for 5 years.  We also reviewed her upcoming radiation treatments and the fact that she is going to feel a little bit tired and have some skin changes towards the end  We then discussed antiestrogens.  She understands the difference between tamoxifen and anastrozole in detail. She understands that anastrozole and the aromatase inhibitors in general work by blocking estrogen production. Accordingly vaginal dryness, decrease in bone density, and of course hot flashes can result. The aromatase inhibitors can also negatively affect the cholesterol profile, although that is a minor effect. One out of 5 women on aromatase inhibitors we will feel "old and achy". This arthralgia/myalgia syndrome, which resembles  fibromyalgia clinically, does resolve with stopping the medications. Accordingly this is not a reason to not try an aromatase inhibitor but it is a frequent reason to stop it (in other words 20% of women will not be able to tolerate these medications).  Tamoxifen on the other hand does not block estrogen production. It does not "take away a woman's estrogen". It blocks the estrogen receptor in breast cells. Like anastrozole, it can also cause hot flashes. As opposed to anastrozole, tamoxifen has many estrogen-like effects. It is technically an estrogen receptor modulator. This means that in some tissues tamoxifen works like estrogen-- for example it helps strengthen the bones. It tends to improve the cholesterol profile. It can cause thickening of the endometrial lining, and even endometrial polyps or rarely cancer of the uterus.(The risk of uterine cancer due to tamoxifen is one additional cancer per thousand women year). It can cause vaginal wetness or stickiness. It can cause blood clots through this estrogen-like effect--the risk of blood clots with tamoxifen is exactly the same as with birth control pills or hormone replacement.  Neither of these agents causes mood changes or weight gain, despite the popular belief that they can have these side effects. We have data from studies comparing either of these drugs with placebo, and in those cases the control group had the same amount of weight gain and depression as the group that took the drug.  Were going to start anastrozole May 08, 2021.  She will have a virtual visit with me (she will be in [place] at that time) 05/28/2021.  If she is tolerating anastrozole well then the plan will be to continue that a total of 5 years.  I am setting her up for a bone density later this month which will serve as her new baseline  Total encounter time 40 minutes.Raymond Gurney C. Cordelia Bessinger, MD 03/26/2021 11:07 AM Medical Oncology and Hematology [facility] [address] [place], [place] [address] Tel. [phone]    Fax. [phone]   This document serves as a record of services personally performed by Ruthann Cancer, MD. It was created on his behalf by Mickie Bail, a trained medical scribe. The creation of this record is based on the scribe's personal observations and the provider's statements to them.  I, Lurline Del MD, have reviewed the above documentation for accuracy and completeness, and I agree with the above.    *Total Encounter Time as defined by the Centers for Medicare and Medicaid Services includes, in addition to the face-to-face time of a patient visit (documented in the note above) non-face-to-face time: obtaining and reviewing outside history, ordering and reviewing medications, tests or procedures, care coordination (communications with other health care professionals or caregivers) and documentation in the medical record.

## 2021-03-27 ENCOUNTER — Ambulatory Visit
Admission: RE | Admit: 2021-03-27 | Discharge: 2021-03-27 | Disposition: A | Discharge status: Home / Self Care | Payer: Medicare Other | Source: Ambulatory Visit | Attending: Radiation Oncology | Admitting: Radiation Oncology

## 2021-03-27 ENCOUNTER — Other Ambulatory Visit: Payer: Self-pay

## 2021-03-27 DIAGNOSIS — M25611 Stiffness of right shoulder, not elsewhere classified: Secondary | ICD-10-CM | POA: Diagnosis not present

## 2021-03-27 DIAGNOSIS — R600 Localized edema: Secondary | ICD-10-CM | POA: Diagnosis not present

## 2021-03-27 DIAGNOSIS — C50411 Malignant neoplasm of upper-outer quadrant of right female breast: Secondary | ICD-10-CM | POA: Diagnosis not present

## 2021-03-27 DIAGNOSIS — M79601 Pain in right arm: Secondary | ICD-10-CM | POA: Diagnosis not present

## 2021-03-27 DIAGNOSIS — Z510 Encounter for antineoplastic radiation therapy: Secondary | ICD-10-CM | POA: Diagnosis not present

## 2021-03-27 DIAGNOSIS — Z170 Estrogen receptor positive status [ER+]: Secondary | ICD-10-CM

## 2021-03-28 ENCOUNTER — Ambulatory Visit
Admission: RE | Admit: 2021-03-28 | Discharge: 2021-03-28 | Disposition: A | Discharge status: Home / Self Care | Payer: Medicare Other | Source: Ambulatory Visit | Attending: Radiation Oncology | Admitting: Radiation Oncology

## 2021-03-28 DIAGNOSIS — M25611 Stiffness of right shoulder, not elsewhere classified: Secondary | ICD-10-CM | POA: Diagnosis not present

## 2021-03-28 DIAGNOSIS — M79601 Pain in right arm: Secondary | ICD-10-CM | POA: Diagnosis not present

## 2021-03-28 DIAGNOSIS — Z510 Encounter for antineoplastic radiation therapy: Secondary | ICD-10-CM | POA: Diagnosis not present

## 2021-03-28 DIAGNOSIS — Z170 Estrogen receptor positive status [ER+]: Secondary | ICD-10-CM | POA: Diagnosis not present

## 2021-03-28 DIAGNOSIS — C50411 Malignant neoplasm of upper-outer quadrant of right female breast: Secondary | ICD-10-CM | POA: Diagnosis not present

## 2021-03-28 DIAGNOSIS — R600 Localized edema: Secondary | ICD-10-CM | POA: Diagnosis not present

## 2021-03-31 ENCOUNTER — Ambulatory Visit: Payer: Medicare Other

## 2021-03-31 ENCOUNTER — Other Ambulatory Visit: Payer: Self-pay

## 2021-03-31 ENCOUNTER — Ambulatory Visit
Admission: RE | Admit: 2021-03-31 | Discharge: 2021-03-31 | Disposition: A | Discharge status: Home / Self Care | Payer: Medicare Other | Source: Ambulatory Visit | Attending: Radiation Oncology | Admitting: Radiation Oncology

## 2021-03-31 DIAGNOSIS — Z483 Aftercare following surgery for neoplasm: Secondary | ICD-10-CM

## 2021-03-31 DIAGNOSIS — M79601 Pain in right arm: Secondary | ICD-10-CM

## 2021-03-31 DIAGNOSIS — M25611 Stiffness of right shoulder, not elsewhere classified: Secondary | ICD-10-CM | POA: Diagnosis not present

## 2021-03-31 DIAGNOSIS — C50411 Malignant neoplasm of upper-outer quadrant of right female breast: Secondary | ICD-10-CM | POA: Diagnosis not present

## 2021-03-31 DIAGNOSIS — R600 Localized edema: Secondary | ICD-10-CM

## 2021-03-31 DIAGNOSIS — Z170 Estrogen receptor positive status [ER+]: Secondary | ICD-10-CM | POA: Diagnosis not present

## 2021-03-31 DIAGNOSIS — Z510 Encounter for antineoplastic radiation therapy: Secondary | ICD-10-CM | POA: Diagnosis not present

## 2021-03-31 DIAGNOSIS — R293 Abnormal posture: Secondary | ICD-10-CM

## 2021-03-31 NOTE — Therapy (Signed)
[facility] @ [place], [place], [address] Phone: [phone]   Fax:  [phone]  Physical Therapy Treatment  Patient Details  Name: Chelsea Lee MRN: [id number] Date of Birth: Feb 06, 1946 Referring Provider (PT): Dr. Coralie Keens   Encounter Date: 03/31/2021   PT End of Session - 03/31/21 0910     Visit Number 6    Number of Visits 10    Date for PT Re-Evaluation 04/10/21    PT Start Time 0902    PT Stop Time 0955    PT Time Calculation (min) 53 min    Activity Tolerance Patient tolerated treatment well    Behavior During Therapy [facility] for tasks assessed/performed             Past Medical History:  Diagnosis Date   Anxiety    Breast cancer ([place])    Cervical radiculopathy    CKD (chronic kidney disease)    stage 3   Hearing loss    HLD (hyperlipidemia)    Obesity    Osteoarthritis     Past Surgical History:  Procedure Laterality Date   BREAST BIOPSY Left    BREAST LUMPECTOMY WITH RADIOACTIVE SEED AND SENTINEL LYMPH NODE BIOPSY Right 02/17/2021   Procedure: RIGHT BREAST LUMPECTOMY WITH RADIOACTIVE SEED AND SENTINEL LYMPH NODE BIOPSY;  Surgeon: Coralie Keens, MD;  Location: [facility];  Service: General;  Laterality: Right;   FOOT SURGERY Right    MENISCUS REPAIR Right 06/08/2006    There were no vitals filed for this visit.   Subjective Assessment - 03/31/21 0903     Subjective I tried the MLD but I am not sure if I am doing it right. My cording at the upper arm feels better, but it is still there at the elbow. My breast was a little sore yesterday. Swelling seems better though.    Pertinent History Patient was diagnosed on 11/11/2020 with right grade II-III invasive ductal carcinoma breast cancer. She underwent a right lumpectomy and sentinel node biopsy ( 3 negative nodes) on 02/17/2021.  It is ER/PR positive and HER2 negative with a Ki67 of 15%. She had a right  foot fusion in 2019 due to osteoarthritis.    Patient Stated Goals See if my arm is doing ok    Currently in Pain? No/denies    Pain Score 0-No pain                               [facility] Adult PT Treatment/Exercise - 03/31/21 0001       Shoulder Exercises: Supine   Other Supine Exercises supine wand flexion and scaption x 5,      Manual Therapy   Manual Therapy Myofascial release;Passive ROM;Manual Lymphatic Drainage (MLD)    Myofascial Release to multiple areas of cording during P/ROM at the upper arm and forearm where cording palpable    Manual Lymphatic Drainage (MLD) In Supine: pt instructed in and reviewed Short neck, 5 diaphragmatic breaths, Rt inguinal and Lt axillary nodes, , anterior inter-axillary and Rt axillo-inguinal anastomosis, then  Rt breast retracing all steps. Pt performed all LN, and pathways and breast  multiple times    Passive ROM PROM right shoulder flex, scaption, and D2 to pts tolerance                          PT Long  Term Goals - 03/13/21 1205       PT LONG TERM GOAL #1   Title Patient will demonstrate she has regained full shoulder ROM and function post operatively compared to baselines.    Time 4    Period Weeks    Status On-going    Target Date 04/10/21      PT LONG TERM GOAL #2   Title Patient will report >/= 25% less painin her right arm to tolerate daily tasks with greater ease.    Time 4    Period Weeks    Status New    Target Date 04/10/21      PT LONG TERM GOAL #3   Title Patient will report >/= 50% reduction in right breast edema.    Time 4    Period Weeks    Status New    Target Date 04/10/21      PT LONG TERM GOAL #4   Title Patient will improve her DASH score to be </= 4.55 for improved arm function.    Time 4    Period Weeks    Status New    Target Date 04/10/21      PT LONG TERM GOAL #5   Title Patient will increase right shoulder flexion to >/= 150 degrees and abduction to >/= 160  degrees for increased ease reaching.    Time 4    Period Weeks    Status New    Target Date 04/10/21                   Plan - 03/31/21 0911     Clinical Impression Statement Right UE cording still present but greatly improved overall as is shoulder ROM.  Reviewed self MLD including the breast overall improved but still requiring VC's and tactile cues for stretch and direction. Firmness decreased in area but still present after MLD   Stability/Clinical Decision Making Stable/Uncomplicated    Rehab Potential Excellent    PT Frequency 2x / week    PT Duration 4 weeks    PT Treatment/Interventions ADLs/Self Care Home Management;Therapeutic exercise;Patient/family education;Manual techniques;Manual lymph drainage;Passive range of motion;Scar mobilization    PT Next Visit Plan Cont and review with pt Manual lymph drainage of right breast reassissng her technique; cont PROM right shoulder and neural stretching (encourage pt to cont this during radiation), MFR cording    PT Home Exercise Plan Closed chain exercises; neural stretching    Consulted and Agree with Plan of Care Patient             Patient will benefit from skilled therapeutic intervention in order to improve the following deficits and impairments:  Postural dysfunction, Decreased range of motion, Impaired UE functional use, Pain, Decreased knowledge of precautions  Visit Diagnosis: Stiffness of right shoulder, not elsewhere classified  Pain in right arm  Localized edema  Aftercare following surgery for neoplasm  Abnormal posture  Malignant neoplasm of upper-outer quadrant of right breast in female, estrogen receptor positive ([place])     Problem List Patient Active Problem List   Diagnosis Date Noted   Malignant neoplasm of upper-outer quadrant of right breast in female, estrogen receptor positive ([place]) 01/20/2021   Family history of Alzheimer's disease 02/21/2019   Primary osteoarthritis of right foot  03/09/2017    Claris Pong, PT 03/31/2021, 9:56 AM  [facility] @ [place], [place], [address] Phone: [phone]   Fax:  [phone]  Name: Chelsea Lee MRN: [id number] Date of Birth: 12-01-1945

## 2021-04-01 ENCOUNTER — Ambulatory Visit
Admission: RE | Admit: 2021-04-01 | Discharge: 2021-04-01 | Disposition: A | Discharge status: Home / Self Care | Payer: Medicare Other | Source: Ambulatory Visit | Attending: Radiation Oncology | Admitting: Radiation Oncology

## 2021-04-01 DIAGNOSIS — M79601 Pain in right arm: Secondary | ICD-10-CM | POA: Diagnosis not present

## 2021-04-01 DIAGNOSIS — M25611 Stiffness of right shoulder, not elsewhere classified: Secondary | ICD-10-CM | POA: Diagnosis not present

## 2021-04-01 DIAGNOSIS — R600 Localized edema: Secondary | ICD-10-CM | POA: Diagnosis not present

## 2021-04-01 DIAGNOSIS — Z510 Encounter for antineoplastic radiation therapy: Secondary | ICD-10-CM | POA: Diagnosis not present

## 2021-04-01 DIAGNOSIS — C50411 Malignant neoplasm of upper-outer quadrant of right female breast: Secondary | ICD-10-CM

## 2021-04-01 DIAGNOSIS — Z170 Estrogen receptor positive status [ER+]: Secondary | ICD-10-CM | POA: Diagnosis not present

## 2021-04-01 MED ORDER — ALRA NON-METALLIC DEODORANT (RAD-ONC)
1.0000 "application " | Freq: Once | TOPICAL | Status: AC
  Administered 2021-04-01: 1 via TOPICAL

## 2021-04-01 MED ORDER — RADIAPLEXRX EX GEL
1.0000 "application " | Freq: Once | CUTANEOUS | Status: AC
  Administered 2021-04-01: 1 via TOPICAL

## 2021-04-02 ENCOUNTER — Ambulatory Visit: Payer: Medicare Other | Admitting: Physical Therapy

## 2021-04-02 ENCOUNTER — Ambulatory Visit
Admission: RE | Admit: 2021-04-02 | Discharge: 2021-04-02 | Disposition: A | Discharge status: Home / Self Care | Payer: Medicare Other | Source: Ambulatory Visit | Attending: Radiation Oncology | Admitting: Radiation Oncology

## 2021-04-02 ENCOUNTER — Other Ambulatory Visit: Payer: Self-pay

## 2021-04-02 ENCOUNTER — Encounter: Payer: Self-pay | Admitting: Physical Therapy

## 2021-04-02 DIAGNOSIS — Z510 Encounter for antineoplastic radiation therapy: Secondary | ICD-10-CM | POA: Diagnosis not present

## 2021-04-02 DIAGNOSIS — M79601 Pain in right arm: Secondary | ICD-10-CM

## 2021-04-02 DIAGNOSIS — C50411 Malignant neoplasm of upper-outer quadrant of right female breast: Secondary | ICD-10-CM | POA: Diagnosis not present

## 2021-04-02 DIAGNOSIS — Z170 Estrogen receptor positive status [ER+]: Secondary | ICD-10-CM | POA: Diagnosis not present

## 2021-04-02 DIAGNOSIS — R600 Localized edema: Secondary | ICD-10-CM

## 2021-04-02 DIAGNOSIS — Z483 Aftercare following surgery for neoplasm: Secondary | ICD-10-CM

## 2021-04-02 DIAGNOSIS — M25611 Stiffness of right shoulder, not elsewhere classified: Secondary | ICD-10-CM

## 2021-04-02 DIAGNOSIS — R293 Abnormal posture: Secondary | ICD-10-CM

## 2021-04-02 NOTE — Therapy (Signed)
[facility] @ [address] [place], [place], [address] Phone: [phone]   Fax:  [phone]  Physical Therapy Treatment  Patient Details  Name: Chelsea Lee MRN: [id number] Date of Birth: 1945-11-18 Referring Provider (PT): Dr. Coralie Keens   Encounter Date: 04/02/2021   PT End of Session - 04/02/21 1054     Visit Number 7    Number of Visits 10    Date for PT Re-Evaluation 04/10/21    PT Start Time 1004    PT Stop Time 1054    PT Time Calculation (min) 50 min    Activity Tolerance Patient tolerated treatment well    Behavior During Therapy [facility] for tasks assessed/performed             Past Medical History:  Diagnosis Date   Anxiety    Breast cancer ([place])    Cervical radiculopathy    CKD (chronic kidney disease)    stage 3   Hearing loss    HLD (hyperlipidemia)    Obesity    Osteoarthritis     Past Surgical History:  Procedure Laterality Date   BREAST BIOPSY Left    BREAST LUMPECTOMY WITH RADIOACTIVE SEED AND SENTINEL LYMPH NODE BIOPSY Right 02/17/2021   Procedure: RIGHT BREAST LUMPECTOMY WITH RADIOACTIVE SEED AND SENTINEL LYMPH NODE BIOPSY;  Surgeon: Coralie Keens, MD;  Location: [facility];  Service: General;  Laterality: Right;   FOOT SURGERY Right    MENISCUS REPAIR Right 06/08/2006    There were no vitals filed for this visit.   Subjective Assessment - 04/02/21 1005     Subjective My breast felt hot and I put the cream on and it helped. Today is my 5th radiation.    Pertinent History Patient was diagnosed on 11/11/2020 with right grade II-III invasive ductal carcinoma breast cancer. She underwent a right lumpectomy and sentinel node biopsy ( 3 negative nodes) on 02/17/2021.  It is ER/PR positive and HER2 negative with a Ki67 of 15%. She had a right foot fusion in 2019 due to osteoarthritis.    Patient Stated Goals See if my arm is doing ok    Currently in Pain? No/denies     Pain Score 0-No pain                               [facility] Adult PT Treatment/Exercise - 04/02/21 0001       Manual Therapy   Myofascial Release to cording at antecubital fossa and upper arm where 1 cord was mildly palpable    Manual Lymphatic Drainage (MLD) In Supine: pt instructed in and reviewed Short neck, 5 diaphragmatic breaths, Rt inguinal and Lt axillary nodes, , anterior inter-axillary and Rt axillo-inguinal anastomosis, then  Rt breast retracing all steps.    Passive ROM PROM right shoulder flex, scaption, and D2 to pts tolerance                          PT Long Term Goals - 03/13/21 1205       PT LONG TERM GOAL #1   Title Patient will demonstrate she has regained full shoulder ROM and function post operatively compared to baselines.    Time 4    Period Weeks    Status On-going    Target Date 04/10/21      PT LONG TERM GOAL #2   Title  Patient will report >/= 25% less painin her right arm to tolerate daily tasks with greater ease.    Time 4    Period Weeks    Status New    Target Date 04/10/21      PT LONG TERM GOAL #3   Title Patient will report >/= 50% reduction in right breast edema.    Time 4    Period Weeks    Status New    Target Date 04/10/21      PT LONG TERM GOAL #4   Title Patient will improve her DASH score to be </= 4.55 for improved arm function.    Time 4    Period Weeks    Status New    Target Date 04/10/21      PT LONG TERM GOAL #5   Title Patient will increase right shoulder flexion to >/= 150 degrees and abduction to >/= 160 degrees for increased ease reaching.    Time 4    Period Weeks    Status New    Target Date 04/10/21                   Plan - 04/02/21 1059     Clinical Impression Statement Pt's cording seems to be improving as only 1 cord was mildly palpable today. Pt still presents with increased fibrosis at superior breast. Educated pt to massage this area prior to doing MLD to  help break up some of the swelling so it can move more easily. Pt verbalized understanding. Continued with PROM which is also improving. Her breast is becoming slightly red now from radiation.    PT Frequency 2x / week    PT Duration 4 weeks    PT Treatment/Interventions ADLs/Self Care Home Management;Therapeutic exercise;Patient/family education;Manual techniques;Manual lymph drainage;Passive range of motion;Scar mobilization    PT Next Visit Plan Cont and review with pt Manual lymph drainage of right breast reassissng her technique; cont PROM right shoulder and neural stretching (encourage pt to cont this during radiation), MFR cording    PT Home Exercise Plan Closed chain exercises; neural stretching    Consulted and Agree with Plan of Care Patient             Patient will benefit from skilled therapeutic intervention in order to improve the following deficits and impairments:  Postural dysfunction, Decreased range of motion, Impaired UE functional use, Pain, Decreased knowledge of precautions  Visit Diagnosis: Stiffness of right shoulder, not elsewhere classified  Pain in right arm  Localized edema  Aftercare following surgery for neoplasm  Abnormal posture  Malignant neoplasm of upper-outer quadrant of right breast in female, estrogen receptor positive (HCC)     Problem List Patient Active Problem List   Diagnosis Date Noted   Malignant neoplasm of upper-outer quadrant of right breast in female, estrogen receptor positive (HCC) 01/20/2021   Family history of Alzheimer's disease 02/21/2019   Primary osteoarthritis of right foot 03/09/2017    Blaire Breedlove Blue, PT 04/02/2021, 11:02 AM  [facility] @ [place] [address] [place], [place], [address] Phone: [phone]   Fax:  [phone]  Name: Chelsea Lee MRN: [id number] Date of Birth: 12/10/1945   Blaire Breedlove Blue, PT 04/02/21 11:02 AM  

## 2021-04-03 ENCOUNTER — Ambulatory Visit
Admission: RE | Admit: 2021-04-03 | Discharge: 2021-04-03 | Disposition: A | Discharge status: Home / Self Care | Payer: Medicare Other | Source: Ambulatory Visit | Attending: Radiation Oncology | Admitting: Radiation Oncology

## 2021-04-03 DIAGNOSIS — R600 Localized edema: Secondary | ICD-10-CM | POA: Diagnosis not present

## 2021-04-03 DIAGNOSIS — Z170 Estrogen receptor positive status [ER+]: Secondary | ICD-10-CM | POA: Diagnosis not present

## 2021-04-03 DIAGNOSIS — M25611 Stiffness of right shoulder, not elsewhere classified: Secondary | ICD-10-CM | POA: Diagnosis not present

## 2021-04-03 DIAGNOSIS — C50411 Malignant neoplasm of upper-outer quadrant of right female breast: Secondary | ICD-10-CM | POA: Diagnosis not present

## 2021-04-03 DIAGNOSIS — M79601 Pain in right arm: Secondary | ICD-10-CM | POA: Diagnosis not present

## 2021-04-03 DIAGNOSIS — Z510 Encounter for antineoplastic radiation therapy: Secondary | ICD-10-CM | POA: Diagnosis not present

## 2021-04-04 ENCOUNTER — Ambulatory Visit
Admission: RE | Admit: 2021-04-04 | Discharge: 2021-04-04 | Disposition: A | Discharge status: Home / Self Care | Payer: Medicare Other | Source: Ambulatory Visit | Attending: Radiation Oncology | Admitting: Radiation Oncology

## 2021-04-04 ENCOUNTER — Other Ambulatory Visit: Payer: Self-pay

## 2021-04-04 DIAGNOSIS — C50411 Malignant neoplasm of upper-outer quadrant of right female breast: Secondary | ICD-10-CM | POA: Diagnosis not present

## 2021-04-04 DIAGNOSIS — M25611 Stiffness of right shoulder, not elsewhere classified: Secondary | ICD-10-CM | POA: Diagnosis not present

## 2021-04-04 DIAGNOSIS — Z510 Encounter for antineoplastic radiation therapy: Secondary | ICD-10-CM | POA: Diagnosis not present

## 2021-04-04 DIAGNOSIS — M79601 Pain in right arm: Secondary | ICD-10-CM | POA: Diagnosis not present

## 2021-04-04 DIAGNOSIS — R600 Localized edema: Secondary | ICD-10-CM | POA: Diagnosis not present

## 2021-04-04 DIAGNOSIS — Z170 Estrogen receptor positive status [ER+]: Secondary | ICD-10-CM | POA: Diagnosis not present

## 2021-04-07 ENCOUNTER — Other Ambulatory Visit: Payer: Self-pay

## 2021-04-07 ENCOUNTER — Ambulatory Visit
Admission: RE | Admit: 2021-04-07 | Discharge: 2021-04-07 | Disposition: A | Discharge status: Home / Self Care | Payer: Medicare Other | Source: Ambulatory Visit | Attending: Radiation Oncology | Admitting: Radiation Oncology

## 2021-04-07 DIAGNOSIS — R600 Localized edema: Secondary | ICD-10-CM | POA: Diagnosis not present

## 2021-04-07 DIAGNOSIS — M25611 Stiffness of right shoulder, not elsewhere classified: Secondary | ICD-10-CM | POA: Diagnosis not present

## 2021-04-07 DIAGNOSIS — C50411 Malignant neoplasm of upper-outer quadrant of right female breast: Secondary | ICD-10-CM | POA: Diagnosis not present

## 2021-04-07 DIAGNOSIS — M79601 Pain in right arm: Secondary | ICD-10-CM | POA: Diagnosis not present

## 2021-04-07 DIAGNOSIS — Z510 Encounter for antineoplastic radiation therapy: Secondary | ICD-10-CM | POA: Diagnosis not present

## 2021-04-07 DIAGNOSIS — Z170 Estrogen receptor positive status [ER+]: Secondary | ICD-10-CM | POA: Diagnosis not present

## 2021-04-08 ENCOUNTER — Ambulatory Visit
Admission: RE | Admit: 2021-04-08 | Discharge: 2021-04-08 | Disposition: A | Discharge status: Home / Self Care | Payer: Medicare Other | Source: Ambulatory Visit | Attending: Radiation Oncology | Admitting: Radiation Oncology

## 2021-04-08 ENCOUNTER — Ambulatory Visit: Payer: Medicare Other

## 2021-04-08 DIAGNOSIS — Z170 Estrogen receptor positive status [ER+]: Secondary | ICD-10-CM | POA: Insufficient documentation

## 2021-04-08 DIAGNOSIS — R293 Abnormal posture: Secondary | ICD-10-CM | POA: Insufficient documentation

## 2021-04-08 DIAGNOSIS — M79601 Pain in right arm: Secondary | ICD-10-CM | POA: Insufficient documentation

## 2021-04-08 DIAGNOSIS — M25611 Stiffness of right shoulder, not elsewhere classified: Secondary | ICD-10-CM | POA: Insufficient documentation

## 2021-04-08 DIAGNOSIS — C50411 Malignant neoplasm of upper-outer quadrant of right female breast: Secondary | ICD-10-CM | POA: Insufficient documentation

## 2021-04-08 DIAGNOSIS — Z483 Aftercare following surgery for neoplasm: Secondary | ICD-10-CM | POA: Insufficient documentation

## 2021-04-08 DIAGNOSIS — Z510 Encounter for antineoplastic radiation therapy: Secondary | ICD-10-CM | POA: Insufficient documentation

## 2021-04-08 DIAGNOSIS — R600 Localized edema: Secondary | ICD-10-CM | POA: Insufficient documentation

## 2021-04-08 NOTE — Therapy (Signed)
[facility] @ [place], [place], [address] Phone: [phone]   Fax:  [phone]  Physical Therapy Treatment  Patient Details  Name: Chelsea Lee MRN: [id number] Date of Birth: 25-Mar-1946 Referring Provider (PT): Dr. Coralie Keens   Encounter Date: 04/08/2021   PT End of Session - 04/08/21 1307     Visit Number 8    Number of Visits 10    Date for PT Re-Evaluation 04/10/21    PT Start Time 1303    PT Stop Time 1358    PT Time Calculation (min) 55 min    Activity Tolerance Patient tolerated treatment well    Behavior During Therapy [facility] for tasks assessed/performed             Past Medical History:  Diagnosis Date   Anxiety    Breast cancer ([place])    Cervical radiculopathy    CKD (chronic kidney disease)    stage 3   Hearing loss    HLD (hyperlipidemia)    Obesity    Osteoarthritis     Past Surgical History:  Procedure Laterality Date   BREAST BIOPSY Left    BREAST LUMPECTOMY WITH RADIOACTIVE SEED AND SENTINEL LYMPH NODE BIOPSY Right 02/17/2021   Procedure: RIGHT BREAST LUMPECTOMY WITH RADIOACTIVE SEED AND SENTINEL LYMPH NODE BIOPSY;  Surgeon: Coralie Keens, MD;  Location: [facility];  Service: General;  Laterality: Right;   FOOT SURGERY Right    MENISCUS REPAIR Right 06/08/2006    There were no vitals filed for this visit.   Subjective Assessment - 04/08/21 1303     Subjective Today is 9th radiation.  My skin seems to be doing well. The cording seems to be a little better. I have been doing the MLD but I am not sure if I am doing it right.  Very fatigued from Radiation today. Occasional twinges in breast but no present pain . Leave on Nov. 21 to go to [place] for a month.   Pertinent History Patient was diagnosed on 11/11/2020 with right grade II-III invasive ductal carcinoma breast cancer. She underwent a right lumpectomy and sentinel node biopsy ( 3 negative nodes) on  02/17/2021.  It is ER/PR positive and HER2 negative with a Ki67 of 15%. She had a right foot fusion in 2019 due to osteoarthritis.    Patient Stated Goals See if my arm is doing ok                               [facility] Adult PT Treatment/Exercise - 04/08/21 0001       Manual Therapy   Edema Management Gave pt script for compression bra, and sent demographics to [employer] [facility]    Myofascial Release to multiple areas of cording with arm at approx  90, and 120 to decrease axillary, upper arm and forearm cording    Manual Lymphatic Drainage (MLD) In Supine: Short neck, superficial and deep abdominals, Rt inguinal and Lt axillary nodes,  anterior inter-axillary and Rt axillo-inguinal anastomosis, and focus on right breast and retracing all pathways and ending with LN.    Passive ROM PROM right shoulder flex, scaption, and D2 to pts tolerance                          PT Long Term Goals - 03/13/21 1205       PT LONG  TERM GOAL #1   Title Patient will demonstrate she has regained full shoulder ROM and function post operatively compared to baselines.    Time 4    Period Weeks    Status On-going    Target Date 04/10/21      PT LONG TERM GOAL #2   Title Patient will report >/= 25% less painin her right arm to tolerate daily tasks with greater ease.    Time 4    Period Weeks    Status New    Target Date 04/10/21      PT LONG TERM GOAL #3   Title Patient will report >/= 50% reduction in right breast edema.    Time 4    Period Weeks    Status New    Target Date 04/10/21      PT LONG TERM GOAL #4   Title Patient will improve her DASH score to be </= 4.55 for improved arm function.    Time 4    Period Weeks    Status New    Target Date 04/10/21      PT LONG TERM GOAL #5   Title Patient will increase right shoulder flexion to >/= 150 degrees and abduction to >/= 160 degrees for increased ease reaching.    Time 4    Period Weeks    Status New     Target Date 04/10/21                   Plan - 04/08/21 1359     Clinical Impression Statement Pts right breast mildly red from radiation but not  uncomfortable yet.  She is wearing chip pack in her present bra and it does seem to be softening the fibrotic area.  Right breast still appears swollen especially laterally.  Cording is overall improved but still noted in axilla and antecubital fossa    Stability/Clinical Decision Making Stable/Uncomplicated    Rehab Potential Excellent    PT Frequency 2x / week    PT Duration 4 weeks    PT Treatment/Interventions ADLs/Self Care Home Management;Therapeutic exercise;Patient/family education;Manual techniques;Manual lymph drainage;Passive range of motion;Scar mobilization    PT Next Visit Plan Recert,Cont and review with pt Manual lymph drainage of right breast reassissng her technique; cont PROM right shoulder and neural stretching (encourage pt to cont this during radiation), MFR cording    PT Home Exercise Plan Closed chain exercises; neural stretching    Recommended Other Services script given for compression bra, demographics sent for [employer]    Consulted and Agree with Plan of Care Patient             Patient will benefit from skilled therapeutic intervention in order to improve the following deficits and impairments:  Postural dysfunction, Decreased range of motion, Impaired UE functional use, Pain, Decreased knowledge of precautions  Visit Diagnosis: Localized edema  Stiffness of right shoulder, not elsewhere classified  Aftercare following surgery for neoplasm  Abnormal posture  Malignant neoplasm of upper-outer quadrant of right breast in female, estrogen receptor positive ([place])     Problem List Patient Active Problem List   Diagnosis Date Noted   Malignant neoplasm of upper-outer quadrant of right breast in female, estrogen receptor positive ([place]) 01/20/2021   Family history of Alzheimer's disease 02/21/2019    Primary osteoarthritis of right foot 03/09/2017    Claris Pong, PT 04/08/2021, 2:56 PM  [facility] @ [place], [place],  [address] Phone: [phone]   Fax:  [phone]  Name: Chelsea Lee MRN: [id number] Date of Birth: Jun 27, 1945

## 2021-04-09 ENCOUNTER — Other Ambulatory Visit: Payer: Self-pay

## 2021-04-09 ENCOUNTER — Ambulatory Visit
Admission: RE | Admit: 2021-04-09 | Discharge: 2021-04-09 | Disposition: A | Discharge status: Home / Self Care | Payer: Medicare Other | Source: Ambulatory Visit | Attending: Radiation Oncology | Admitting: Radiation Oncology

## 2021-04-09 DIAGNOSIS — H524 Presbyopia: Secondary | ICD-10-CM | POA: Diagnosis not present

## 2021-04-09 DIAGNOSIS — Z170 Estrogen receptor positive status [ER+]: Secondary | ICD-10-CM | POA: Diagnosis not present

## 2021-04-09 DIAGNOSIS — Z961 Presence of intraocular lens: Secondary | ICD-10-CM | POA: Diagnosis not present

## 2021-04-09 DIAGNOSIS — M25611 Stiffness of right shoulder, not elsewhere classified: Secondary | ICD-10-CM | POA: Diagnosis not present

## 2021-04-09 DIAGNOSIS — Z510 Encounter for antineoplastic radiation therapy: Secondary | ICD-10-CM | POA: Diagnosis not present

## 2021-04-09 DIAGNOSIS — R600 Localized edema: Secondary | ICD-10-CM | POA: Diagnosis not present

## 2021-04-09 DIAGNOSIS — M79601 Pain in right arm: Secondary | ICD-10-CM | POA: Diagnosis not present

## 2021-04-09 DIAGNOSIS — H16223 Keratoconjunctivitis sicca, not specified as Sjogren's, bilateral: Secondary | ICD-10-CM | POA: Diagnosis not present

## 2021-04-09 DIAGNOSIS — C50411 Malignant neoplasm of upper-outer quadrant of right female breast: Secondary | ICD-10-CM | POA: Diagnosis not present

## 2021-04-09 DIAGNOSIS — H04123 Dry eye syndrome of bilateral lacrimal glands: Secondary | ICD-10-CM | POA: Diagnosis not present

## 2021-04-10 ENCOUNTER — Ambulatory Visit
Admission: RE | Admit: 2021-04-10 | Discharge: 2021-04-10 | Disposition: A | Discharge status: Home / Self Care | Payer: Medicare Other | Source: Ambulatory Visit | Attending: Radiation Oncology | Admitting: Radiation Oncology

## 2021-04-10 DIAGNOSIS — M79601 Pain in right arm: Secondary | ICD-10-CM | POA: Diagnosis not present

## 2021-04-10 DIAGNOSIS — M25611 Stiffness of right shoulder, not elsewhere classified: Secondary | ICD-10-CM | POA: Diagnosis not present

## 2021-04-10 DIAGNOSIS — Z510 Encounter for antineoplastic radiation therapy: Secondary | ICD-10-CM | POA: Diagnosis not present

## 2021-04-10 DIAGNOSIS — Z170 Estrogen receptor positive status [ER+]: Secondary | ICD-10-CM | POA: Diagnosis not present

## 2021-04-10 DIAGNOSIS — C50411 Malignant neoplasm of upper-outer quadrant of right female breast: Secondary | ICD-10-CM | POA: Diagnosis not present

## 2021-04-10 DIAGNOSIS — R600 Localized edema: Secondary | ICD-10-CM | POA: Diagnosis not present

## 2021-04-11 ENCOUNTER — Other Ambulatory Visit: Payer: Self-pay

## 2021-04-11 ENCOUNTER — Ambulatory Visit
Admission: RE | Admit: 2021-04-11 | Discharge: 2021-04-11 | Disposition: A | Discharge status: Home / Self Care | Payer: Medicare Other | Source: Ambulatory Visit | Attending: Radiation Oncology | Admitting: Radiation Oncology

## 2021-04-11 DIAGNOSIS — Z510 Encounter for antineoplastic radiation therapy: Secondary | ICD-10-CM | POA: Diagnosis not present

## 2021-04-11 DIAGNOSIS — R600 Localized edema: Secondary | ICD-10-CM | POA: Diagnosis not present

## 2021-04-11 DIAGNOSIS — Z170 Estrogen receptor positive status [ER+]: Secondary | ICD-10-CM | POA: Diagnosis not present

## 2021-04-11 DIAGNOSIS — M79601 Pain in right arm: Secondary | ICD-10-CM | POA: Diagnosis not present

## 2021-04-11 DIAGNOSIS — M25611 Stiffness of right shoulder, not elsewhere classified: Secondary | ICD-10-CM | POA: Diagnosis not present

## 2021-04-11 DIAGNOSIS — C50411 Malignant neoplasm of upper-outer quadrant of right female breast: Secondary | ICD-10-CM | POA: Diagnosis not present

## 2021-04-14 ENCOUNTER — Other Ambulatory Visit: Payer: Self-pay

## 2021-04-14 ENCOUNTER — Ambulatory Visit: Payer: Medicare Other | Admitting: Physical Therapy

## 2021-04-14 ENCOUNTER — Ambulatory Visit
Admission: RE | Admit: 2021-04-14 | Discharge: 2021-04-14 | Disposition: A | Discharge status: Home / Self Care | Payer: Medicare Other | Source: Ambulatory Visit | Attending: Radiation Oncology | Admitting: Radiation Oncology

## 2021-04-14 DIAGNOSIS — R600 Localized edema: Secondary | ICD-10-CM | POA: Diagnosis not present

## 2021-04-14 DIAGNOSIS — C50411 Malignant neoplasm of upper-outer quadrant of right female breast: Secondary | ICD-10-CM

## 2021-04-14 DIAGNOSIS — M79601 Pain in right arm: Secondary | ICD-10-CM | POA: Diagnosis not present

## 2021-04-14 DIAGNOSIS — Z510 Encounter for antineoplastic radiation therapy: Secondary | ICD-10-CM | POA: Diagnosis not present

## 2021-04-14 DIAGNOSIS — M25611 Stiffness of right shoulder, not elsewhere classified: Secondary | ICD-10-CM

## 2021-04-14 DIAGNOSIS — Z170 Estrogen receptor positive status [ER+]: Secondary | ICD-10-CM | POA: Diagnosis not present

## 2021-04-14 DIAGNOSIS — Z483 Aftercare following surgery for neoplasm: Secondary | ICD-10-CM

## 2021-04-14 DIAGNOSIS — R293 Abnormal posture: Secondary | ICD-10-CM

## 2021-04-14 NOTE — Therapy (Signed)
[facility] @ [place], [place], [address] Phone: [phone]   Fax:  [phone]  Physical Therapy Treatment  Patient Details  Name: Chelsea Lee MRN: [id number] Date of Birth: 1946-01-27 Referring Provider (PT): Dr. Coralie Lee   Encounter Date: 04/14/2021   PT End of Session - 04/14/21 1713     Visit Number 9    Number of Visits 13    Date for PT Re-Evaluation 04/28/21    PT Start Time 6415    PT Stop Time 1653    PT Time Calculation (min) 48 min    Activity Tolerance Patient tolerated treatment well    Behavior During Therapy [facility] for tasks assessed/performed             Past Medical History:  Diagnosis Date   Anxiety    Breast cancer ([place])    Cervical radiculopathy    CKD (chronic kidney disease)    stage 3   Hearing loss    HLD (hyperlipidemia)    Obesity    Osteoarthritis     Past Surgical History:  Procedure Laterality Date   BREAST BIOPSY Left    BREAST LUMPECTOMY WITH RADIOACTIVE SEED AND SENTINEL LYMPH NODE BIOPSY Right 02/17/2021   Procedure: RIGHT BREAST LUMPECTOMY WITH RADIOACTIVE SEED AND SENTINEL LYMPH NODE BIOPSY;  Surgeon: Chelsea Keens, MD;  Location: [facility];  Service: General;  Laterality: Right;   FOOT SURGERY Right    MENISCUS REPAIR Right 06/08/2006    There were no vitals filed for this visit.   Subjective Assessment - 04/14/21 1606     Subjective I am trying to do the self massage. I am being pretty consistent. I have 8 more radiation appointments.    Pertinent History Patient was diagnosed on 11/11/2020 with right grade II-III invasive ductal carcinoma breast cancer. She underwent a right lumpectomy and sentinel node biopsy ( 3 negative nodes) on 02/17/2021.  It is ER/PR positive and HER2 negative with a Ki67 of 15%. She had a right foot fusion in 2019 due to osteoarthritis.    Patient Stated Goals See if my arm is doing ok    Currently in  Pain? No/denies    Pain Score 0-No pain                               [facility] Adult PT Treatment/Exercise - 04/14/21 0001       Manual Therapy   Myofascial Release to cording at antebuital fossa    Manual Lymphatic Drainage (MLD) In Supine: Short neck, superficial and deep abdominals, Rt inguinal and Lt axillary nodes,  anterior inter-axillary and Rt axillo-inguinal anastomosis, and focus on right breast and retracing all pathways and ending with LN.    Passive ROM PROM right shoulder flex, and abduction                     PT Education - 04/14/21 1715     Education Details Flexi touch compression pump, benefits of the pump, what it looks like, how it works    [employer]) Educated Patient    Methods Explanation;Handout    Comprehension Verbalized understanding                 PT Long Term Goals - 04/14/21 1717       PT LONG TERM GOAL #1   Title Patient will demonstrate she has regained  full shoulder ROM and function post operatively compared to baselines.    Time 4    Period Weeks    Status On-going      PT LONG TERM GOAL #2   Title Patient will report >/= 25% less painin her right arm to tolerate daily tasks with greater ease.    Time 4    Period Weeks    Status On-going      PT LONG TERM GOAL #3   Title Patient will report >/= 50% reduction in right breast edema.    Baseline 04/14/21- pt reports her swelling has reduced some    Time 4    Period Weeks    Status On-going      PT LONG TERM GOAL #4   Title Patient will improve her DASH score to be </= 4.55 for improved arm function.    Time 4    Period Weeks    Status On-going      PT LONG TERM GOAL #5   Title Patient will increase right shoulder flexion to >/= 150 degrees and abduction to >/= 160 degrees for increased ease reaching.    Time 4    Period Weeks    Status On-going                   Plan - 04/14/21 1716     Clinical Impression Statement Educated pt  about the FlexiTouch compression pump. She has heard from [employer] but still is not sure if her insurance covers the pump. She is interested in a trial of the pump but is not sure if she wants to purchase it at this time. She plans on getting a compression bra soon. Continued with MLD to R breast and myofascial release to cording in R axilla.    PT Frequency 2x / week    PT Duration 4 weeks    PT Treatment/Interventions ADLs/Self Care Home Management;Therapeutic exercise;Patient/family education;Manual techniques;Manual lymph drainage;Passive range of motion;Scar mobilization    PT Next Visit Plan Updated goals, ,Cont and review with pt Manual lymph drainage of right breast reassissng her technique; cont PROM right shoulder and neural stretching (encourage pt to cont this during radiation), MFR cording    PT Home Exercise Plan Closed chain exercises; neural stretching    Consulted and Agree with Plan of Care Patient             Patient will benefit from skilled therapeutic intervention in order to improve the following deficits and impairments:  Postural dysfunction, Decreased range of motion, Impaired UE functional use, Pain, Decreased knowledge of precautions  Visit Diagnosis: Localized edema  Stiffness of right shoulder, not elsewhere classified  Aftercare following surgery for neoplasm  Abnormal posture  Malignant neoplasm of upper-outer quadrant of right breast in female, estrogen receptor positive ([place])     Problem List Patient Active Problem List   Diagnosis Date Noted   Malignant neoplasm of upper-outer quadrant of right breast in female, estrogen receptor positive ([place]) 01/20/2021   Family history of Alzheimer's disease 02/21/2019   Primary osteoarthritis of right foot 03/09/2017    Chelsea Lee, PT 04/14/2021, 5:18 PM  [facility] @ [place], [place], [address] Phone: [phone]   Fax:   [phone]  Name: Chelsea Lee MRN: [id number] Date of Birth: [birth year]   Chelsea Lee, PT 04/14/21 5:19 PM

## 2021-04-15 ENCOUNTER — Ambulatory Visit: Payer: Medicare Other | Admitting: Radiation Oncology

## 2021-04-15 ENCOUNTER — Ambulatory Visit
Admission: RE | Admit: 2021-04-15 | Discharge: 2021-04-15 | Disposition: A | Discharge status: Home / Self Care | Payer: Medicare Other | Source: Ambulatory Visit | Attending: Radiation Oncology | Admitting: Radiation Oncology

## 2021-04-15 DIAGNOSIS — Z510 Encounter for antineoplastic radiation therapy: Secondary | ICD-10-CM | POA: Diagnosis not present

## 2021-04-15 DIAGNOSIS — M25611 Stiffness of right shoulder, not elsewhere classified: Secondary | ICD-10-CM | POA: Diagnosis not present

## 2021-04-15 DIAGNOSIS — R600 Localized edema: Secondary | ICD-10-CM | POA: Diagnosis not present

## 2021-04-15 DIAGNOSIS — M79601 Pain in right arm: Secondary | ICD-10-CM | POA: Diagnosis not present

## 2021-04-15 DIAGNOSIS — Z170 Estrogen receptor positive status [ER+]: Secondary | ICD-10-CM | POA: Diagnosis not present

## 2021-04-15 DIAGNOSIS — C50411 Malignant neoplasm of upper-outer quadrant of right female breast: Secondary | ICD-10-CM | POA: Diagnosis not present

## 2021-04-16 ENCOUNTER — Ambulatory Visit: Payer: Medicare Other | Admitting: Physical Therapy

## 2021-04-16 ENCOUNTER — Encounter: Payer: Self-pay | Admitting: Physical Therapy

## 2021-04-16 ENCOUNTER — Ambulatory Visit
Admission: RE | Admit: 2021-04-16 | Discharge: 2021-04-16 | Disposition: A | Discharge status: Home / Self Care | Payer: Medicare Other | Source: Ambulatory Visit | Attending: Radiation Oncology | Admitting: Radiation Oncology

## 2021-04-16 ENCOUNTER — Other Ambulatory Visit: Payer: Self-pay

## 2021-04-16 DIAGNOSIS — M79601 Pain in right arm: Secondary | ICD-10-CM | POA: Diagnosis not present

## 2021-04-16 DIAGNOSIS — M25611 Stiffness of right shoulder, not elsewhere classified: Secondary | ICD-10-CM

## 2021-04-16 DIAGNOSIS — R600 Localized edema: Secondary | ICD-10-CM

## 2021-04-16 DIAGNOSIS — C50411 Malignant neoplasm of upper-outer quadrant of right female breast: Secondary | ICD-10-CM

## 2021-04-16 DIAGNOSIS — R293 Abnormal posture: Secondary | ICD-10-CM

## 2021-04-16 DIAGNOSIS — Z483 Aftercare following surgery for neoplasm: Secondary | ICD-10-CM

## 2021-04-16 DIAGNOSIS — Z170 Estrogen receptor positive status [ER+]: Secondary | ICD-10-CM | POA: Diagnosis not present

## 2021-04-16 DIAGNOSIS — Z510 Encounter for antineoplastic radiation therapy: Secondary | ICD-10-CM | POA: Diagnosis not present

## 2021-04-16 NOTE — Therapy (Signed)
[facility] @ [place], [place], [address] Phone: [phone]   Fax:  [phone]  Physical Therapy Treatment  Patient Details  Name: Chelsea Lee MRN: [id number] Date of Birth: [birth year] Referring Provider (PT): Dr. Coralie Keens   Encounter Date: 04/16/2021   PT End of Session - 04/16/21 1101     Visit Number 10    Number of Visits 13    Date for PT Re-Evaluation 04/28/21    PT Start Time 1008    PT Stop Time 1055    PT Time Calculation (min) 47 min    Activity Tolerance Patient tolerated treatment well    Behavior During Therapy [facility] for tasks assessed/performed             Past Medical History:  Diagnosis Date   Anxiety    Breast cancer ([place])    Cervical radiculopathy    CKD (chronic kidney disease)    stage 3   Hearing loss    HLD (hyperlipidemia)    Obesity    Osteoarthritis     Past Surgical History:  Procedure Laterality Date   BREAST BIOPSY Left    BREAST LUMPECTOMY WITH RADIOACTIVE SEED AND SENTINEL LYMPH NODE BIOPSY Right 02/17/2021   Procedure: RIGHT BREAST LUMPECTOMY WITH RADIOACTIVE SEED AND SENTINEL LYMPH NODE BIOPSY;  Surgeon: Coralie Keens, MD;  Location: [facility];  Service: General;  Laterality: Right;   FOOT SURGERY Right    MENISCUS REPAIR Right 06/08/2006    There were no vitals filed for this visit.   Subjective Assessment - 04/16/21 1008     Subjective I got my compression bra.    Pertinent History Patient was diagnosed on 11/11/2020 with right grade II-III invasive ductal carcinoma breast cancer. She underwent a right lumpectomy and sentinel node biopsy ( 3 negative nodes) on 02/17/2021.  It is ER/PR positive and HER2 negative with a Ki67 of 15%. She had a right foot fusion in 2019 due to osteoarthritis.    Patient Stated Goals See if my arm is doing ok    Currently in Pain? No/denies    Pain Score 0-No pain                [facility] PT  Assessment - 04/16/21 0001       AROM   Right Shoulder Flexion 155 Degrees    Right Shoulder ABduction 173 Degrees                   Quick Dash - 04/16/21 0001     Open a tight or new jar No difficulty    Do heavy household chores (wash walls, wash floors) No difficulty    Carry a shopping bag or briefcase No difficulty    Wash your back No difficulty    Use a knife to cut food No difficulty    Recreational activities in which you take some force or impact through your arm, shoulder, or hand (golf, hammering, tennis) No difficulty    During the past week, to what extent has your arm, shoulder or hand problem interfered with your normal social activities with family, friends, neighbors, or groups? Not at all    During the past week, to what extent has your arm, shoulder or hand problem limited your work or other regular daily activities Not at all    Arm, shoulder, or hand pain. None    Tingling (pins and needles) in your arm, shoulder, or  hand None    Difficulty Sleeping No difficulty    DASH Score 0 %                    [facility] Adult PT Treatment/Exercise - 04/16/21 0001       Manual Therapy   Manual Therapy Soft tissue mobilization    Soft tissue mobilization to R pec and area of fibrosis surrounding lumpectomy scar    Manual Lymphatic Drainage (MLD) In Supine: Short neck, superficial and deep abdominals, Rt inguinal and Lt axillary nodes,  anterior inter-axillary and Rt axillo-inguinal anastomosis, and focus on right breast and retracing all pathways and ending with LN.                          PT Long Term Goals - 04/16/21 1014       PT LONG TERM GOAL #1   Title Patient will demonstrate she has regained full shoulder ROM and function post operatively compared to baselines.    Time 4    Period Weeks    Status Achieved      PT LONG TERM GOAL #2   Title Patient will report >/= 25% less painin her right arm to tolerate daily tasks with  greater ease.    Baseline 04/16/21- 100% improved    Period Weeks    Status Achieved      PT LONG TERM GOAL #3   Title Patient will report >/= 50% reduction in right breast edema.    Baseline 04/14/21- pt reports her swelling has reduced some; 04/16/21- 25% improved    Time 4    Period Weeks    Status On-going      PT LONG TERM GOAL #4   Title Patient will improve her DASH score to be </= 4.55 for improved arm function.    Baseline 04/16/21- 0    Time 4    Period Weeks    Status Achieved      PT LONG TERM GOAL #5   Title Patient will increase right shoulder flexion to >/= 150 degrees and abduction to >/= 160 degrees for increased ease reaching.    Baseline 04/16/21- flexion: 155, abduction: 173    Time 4    Period Weeks    Status Achieved                   Plan - 04/16/21 1021     Clinical Impression Statement Continued with MLD to R breast today. Pt received her compression bra. Educated pt to wear it as much as possible unless her skin becomes too irritated from radiation. Pt has not heard from [facility] to set up a demonstration. She would like that to be done in clinic. Spent extra time of soft tissue mobilization to areas of fibrosis today with some softening noted. Educated pt to continue to work on this at home. Pt will be on hold while she is out of town and then will be reassessed in Jan to see if her swelling has been controlled.    PT Frequency 2x / week    PT Duration 4 weeks    PT Treatment/Interventions ADLs/Self Care Home Management;Therapeutic exercise;Patient/family education;Manual techniques;Manual lymph drainage;Passive range of motion;Scar mobilization    PT Next Visit Plan cont MLD to R breast, pt will be on hold for the month of Dec and will come back in Jan for reassess to see how her swelling is and to repeat SOZO  Consulted and Agree with Plan of Care Patient             Patient will benefit from skilled therapeutic intervention in order to  improve the following deficits and impairments:  Postural dysfunction, Decreased range of motion, Impaired UE functional use, Pain, Decreased knowledge of precautions  Visit Diagnosis: Localized edema  Stiffness of right shoulder, not elsewhere classified  Aftercare following surgery for neoplasm  Abnormal posture  Malignant neoplasm of upper-outer quadrant of right breast in female, estrogen receptor positive [facility])     Problem List Patient Active Problem List   Diagnosis Date Noted   Malignant neoplasm of upper-outer quadrant of right breast in female, estrogen receptor positive ([place]) 01/20/2021   Family history of Alzheimer's disease 02/21/2019   Primary osteoarthritis of right foot 03/09/2017    Allyson Sabal [place], PT 04/16/2021, 11:02 AM  [facility] @ [place], [place], [address] Phone: [phone]   Fax:  [phone]  Name: LAKEYSHA SLUTSKY MRN: [id number] Date of Birth: May 09, 1946  [name], PT 04/16/21 11:02 AM

## 2021-04-17 ENCOUNTER — Ambulatory Visit
Admission: RE | Admit: 2021-04-17 | Discharge: 2021-04-17 | Disposition: A | Discharge status: Home / Self Care | Payer: Medicare Other | Source: Ambulatory Visit | Attending: Radiation Oncology | Admitting: Radiation Oncology

## 2021-04-17 DIAGNOSIS — Z510 Encounter for antineoplastic radiation therapy: Secondary | ICD-10-CM | POA: Diagnosis not present

## 2021-04-17 DIAGNOSIS — R600 Localized edema: Secondary | ICD-10-CM | POA: Diagnosis not present

## 2021-04-17 DIAGNOSIS — M79601 Pain in right arm: Secondary | ICD-10-CM | POA: Diagnosis not present

## 2021-04-17 DIAGNOSIS — Z170 Estrogen receptor positive status [ER+]: Secondary | ICD-10-CM | POA: Diagnosis not present

## 2021-04-17 DIAGNOSIS — C50411 Malignant neoplasm of upper-outer quadrant of right female breast: Secondary | ICD-10-CM | POA: Diagnosis not present

## 2021-04-17 DIAGNOSIS — M25611 Stiffness of right shoulder, not elsewhere classified: Secondary | ICD-10-CM | POA: Diagnosis not present

## 2021-04-18 ENCOUNTER — Telehealth: Payer: Self-pay | Admitting: Oncology

## 2021-04-18 ENCOUNTER — Other Ambulatory Visit: Payer: Self-pay

## 2021-04-18 ENCOUNTER — Ambulatory Visit
Admission: RE | Admit: 2021-04-18 | Discharge: 2021-04-18 | Disposition: A | Discharge status: Home / Self Care | Payer: Medicare Other | Source: Ambulatory Visit | Attending: Radiation Oncology | Admitting: Radiation Oncology

## 2021-04-18 DIAGNOSIS — Z23 Encounter for immunization: Secondary | ICD-10-CM | POA: Diagnosis not present

## 2021-04-18 DIAGNOSIS — R600 Localized edema: Secondary | ICD-10-CM | POA: Diagnosis not present

## 2021-04-18 DIAGNOSIS — C50411 Malignant neoplasm of upper-outer quadrant of right female breast: Secondary | ICD-10-CM | POA: Diagnosis not present

## 2021-04-18 DIAGNOSIS — M79601 Pain in right arm: Secondary | ICD-10-CM | POA: Diagnosis not present

## 2021-04-18 DIAGNOSIS — Z170 Estrogen receptor positive status [ER+]: Secondary | ICD-10-CM | POA: Diagnosis not present

## 2021-04-18 DIAGNOSIS — M25611 Stiffness of right shoulder, not elsewhere classified: Secondary | ICD-10-CM | POA: Diagnosis not present

## 2021-04-18 DIAGNOSIS — Z510 Encounter for antineoplastic radiation therapy: Secondary | ICD-10-CM | POA: Diagnosis not present

## 2021-04-18 NOTE — Telephone Encounter (Signed)
Scheduled per sch msg. Called and left msg  

## 2021-04-20 NOTE — Progress Notes (Signed)
[facility]  Telephone:([phone]) [phone] Fax:(336) [phone]    Chelsea Lee DOB: [birth year]  MR#: [id number]  YWV#:[id number]  Patient Care Team: Velna Hatchet, MD as PCP - General (Internal Medicine) Crist Infante, MD as Consulting Physician (Internal Medicine) Mauro Kaufmann, RN as Oncology Nurse Navigator Rockwell Germany, RN as Oncology Nurse Navigator Coralie Keens, MD as Consulting Physician (General Surgery) Shayma Pfefferle, Virgie Dad, MD as Consulting Physician (Oncology) Gery Pray, MD as Consulting Physician (Radiation Oncology) Harriett Sine, MD as Consulting Physician (Dermatology) Melrose Nakayama, MD as Consulting Physician (Orthopedic Surgery) Tyler Pita, MD as Referring Physician (Otolaryngology) Chauncey Cruel, MD OTHER MD:  CHIEF COMPLAINT: Estrogen receptor positive breast cancer  CURRENT TREATMENT: Completing adjuvant radiation; to start anastrozole   INTERVAL HISTORY: Chelsea Lee returns today for follow-up of her breast cancer accompanied by her husband Chelsea Lee.  Since her last visit, she began adjuvant radiation therapy on 03/27/2021. She is scheduled to finish on 04/24/2021.  She is also scheduled for bone density screening on 04/24/2021.  REVIEW OF SYSTEMS: Chelsea Lee is tolerating radiation generally well.  She is developing some erythema but so far and no peeling.  She has some fatigue but she is managing to do all her activities of daily living.  She is planning a long trip to [place] with a lot of family visiting.  A detailed review of systems today was otherwise stable.   COVID 19 VACCINATION STATUS: Pfizer x5 as of November 2022   HISTORY OF CURRENT ILLNESS: From the original intake note:  Chelsea Lee had routine screening mammography on 11/11/2020 showing a possible abnormality in the bilateral breasts. She underwent bilateral diagnostic mammography with tomography and right breast ultrasonography at [facility]  on 01/08/2021 showing: breast density category C; 2.6 cm posterior upper-outer right breast mass; no abnormal right axillary lymph nodes; more likely benign complex cystic changes within inner right breast; benign left breast calcifications.  Accordingly on 01/13/2021 she proceeded to biopsy of the right breast area in question. The pathology from the 11 o'clock mass ([id number]) showed: invasive ductal carcinoma, grade 2-3; ductal carcinoma in situ with necrosis. Prognostic indicators significant for: estrogen receptor, >95% positive and progesterone receptor, >95% positive, both with strong staining intensity. Proliferation marker Ki67 at 15%. HER2 equivocal by immunohistochemistry (2+), but negative by fluorescent in situ hybridization with a signals ratio 1.3 and number per cell 1.75.  The area in the right breast at 2:30 was biopsied at the same time showing fibrocystic change with apocrine metaplasia.  Cancer Staging Malignant neoplasm of upper-outer quadrant of right breast in female, estrogen receptor positive ([place]) Staging form: Breast, AJCC 8th Edition - Clinical stage from 01/22/2021: Stage IB (cT2, cN0, cM0, G2, ER+, PR+, HER2-) - Signed by Chauncey Cruel, MD on 01/22/2021 Stage prefix: Initial diagnosis Histologic grading system: 3 grade system  The patient's subsequent history is as detailed below.   PAST MEDICAL HISTORY: Past Medical History:  Diagnosis Date   Anxiety    Breast cancer ([place])    Cervical radiculopathy    CKD (chronic kidney disease)    stage 3   Hearing loss    HLD (hyperlipidemia)    Obesity    Osteoarthritis   She reports a history of skin cancer, treated with 19 fractions of radiation therapy while living in [place] (Dr. Juanito Doom)   PAST SURGICAL HISTORY: Past Surgical History:  Procedure Laterality Date   BREAST BIOPSY Left    BREAST LUMPECTOMY WITH  RADIOACTIVE SEED AND SENTINEL LYMPH NODE BIOPSY Right 02/17/2021   Procedure: RIGHT BREAST LUMPECTOMY  WITH RADIOACTIVE SEED AND SENTINEL LYMPH NODE BIOPSY;  Surgeon: Coralie Keens, MD;  Location: [facility];  Service: General;  Laterality: Right;   FOOT SURGERY Right    MENISCUS REPAIR Right 06/08/2006    FAMILY HISTORY: Family History  Problem Relation Age of Onset   Diabetes Mother    Dementia Mother    Heart disease Father        age 66 death   Alcohol abuse Father    Her father died at age 20 from MI. Her mother died at age 32. Chelsea Lee has three brothers (and no sisters). There is no family history of cancer to her knowledge.   GYNECOLOGIC HISTORY:  No LMP recorded. Patient is postmenopausal. Menarche: 75 years old Age at first live birth: 75 years old [place] P 2 LMP date unsure Contraceptive: used from age "1 to ?"  (Several years) HRT never used  Hysterectomy? no BSO? 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 Husband Chelsea Baltimore "Chelsea Lee" is a Clinical biochemist. She lives at home with Chelsea Lee; they have no pets. Son Chelsea Lee, age 71, is self employed and lives in [place], [place]. Daughter Chelsea Lee," age 97, is a Doctor, hospital for [employer] in [place], [place]. Chelsea Lee attends [employer].    ADVANCED DIRECTIVES: In the absence of any documentation to the contrary, the patient's spouse is their HCPOA.    HEALTH MAINTENANCE: Social History   Tobacco Use   Smoking status: Never   Smokeless tobacco: Never  Substance Use Topics   Alcohol use: Yes    Alcohol/week: 0.0 standard drinks    Comment: social   Drug use: No     Colonoscopy: yes, done in [place]  PAP: date unsure  Bone density: date unsure   No Known Allergies  Current Outpatient Medications  Medication Sig Dispense Refill   ALPRAZolam (XANAX) 1 MG tablet Take 1 tablet (1 mg total) by mouth 3 (three) times daily as needed for anxiety. 30 tablet 0   anastrozole (ARIMIDEX) 1 MG tablet Take 1 tablet (1 mg total) by mouth daily.  Start May 08, 2021 90 tablet 4   Apoaequorin (PREVAGEN PO) Take 1 tablet by mouth daily.     celecoxib (CELEBREX) 200 MG capsule Take 200 mg by mouth 2 (two) times daily.     cholecalciferol (VITAMIN D3) 25 MCG (1000 UNIT) tablet Take 1 tablet (1,000 Units total) by mouth daily.     gabapentin (NEURONTIN) 300 MG capsule Take 1 capsule (300 mg total) by mouth 2 (two) times daily. 270 capsule 3   lubiprostone (AMITIZA) 8 MCG capsule Take 1 capsule by mouth 2 (two) times daily.     simvastatin (ZOCOR) 40 MG tablet Take 1 tablet (40 mg total) by mouth at bedtime. 30 tablet    No current facility-administered medications for this visit.    OBJECTIVE: White woman who appears  Vitals:   04/21/21 1513  BP: (!) 142/75  Pulse: 84  Resp: 18  Temp: 97.9 F (36.6 C)  SpO2: 98%      Body mass index is 31.12 kg/m.   Wt Readings from Last 3 Encounters:  04/21/21 204 lb 11.2 oz (92.9 kg)  03/26/21 204 lb 9.6 oz (92.8 kg)  03/17/21 204 lb 12.8 oz (92.9 kg)      ECOG FS:1 - Symptomatic but  completely ambulatory  Sclerae unicteric, EOMs intact Wearing a mask No cervical or supraclavicular adenopathy Lungs no rales or rhonchi Heart regular rate and rhythm Abd soft, nontender, positive bowel sounds MSK no focal spinal tenderness, no upper extremity lymphedema Neuro: nonfocal, well oriented, appropriate affect Breasts: The right breast is status postlumpectomy and is currently receiving radiation.  There is erythema but no desquamation.  The overall cosmetic result is very good.  Left breast and both axillae are benign.   LAB RESULTS:  CMP     Component Value Date/Time   NA 142 01/22/2021 0825   K 4.4 01/22/2021 0825   CL 109 01/22/2021 0825   CO2 22 01/22/2021 0825   GLUCOSE 160 (H) 01/22/2021 0825   BUN 24 (H) 01/22/2021 0825   CREATININE 1.25 (H) 01/22/2021 0825   CALCIUM 9.0 01/22/2021 0825   PROT 6.5 01/22/2021 0825   ALBUMIN 3.9 01/22/2021 0825   AST 25 01/22/2021 0825    ALT 29 01/22/2021 0825   ALKPHOS 99 01/22/2021 0825   BILITOT 0.5 01/22/2021 0825   GFRNONAA 44 (L) 01/22/2021 0825    No results found for: TOTALPROTELP, ALBUMINELP, A1GS, A2GS, BETS, BETA2SER, GAMS, MSPIKE, SPEI  Lab Results  Component Value Date   WBC 6.3 01/22/2021   NEUTROABS 3.2 01/22/2021   HGB 12.7 01/22/2021   HCT 39.2 01/22/2021   MCV 98.5 01/22/2021   PLT 151 01/22/2021    No results found for: LABCA2  No components found for: JMEQAS341  No results for input(s): INR in the last 168 hours.  No results found for: LABCA2  No results found for: DQQ229  No results found for: NLG921  No results found for: JHE174  No results found for: CA2729  No components found for: HGQUANT  No results found for: CEA1 / No results found for: CEA1   No results found for: AFPTUMOR  No results found for: CHROMOGRNA  No results found for: KPAFRELGTCHN, LAMBDASER, KAPLAMBRATIO (kappa/lambda light chains)  No results found for: HGBA, HGBA2QUANT, HGBFQUANT, HGBSQUAN (Hemoglobinopathy evaluation)   No results found for: LDH  No results found for: IRON, TIBC, IRONPCTSAT (Iron and TIBC)  No results found for: FERRITIN  Urinalysis No results found for: COLORURINE, APPEARANCEUR, LABSPEC, PHURINE, GLUCOSEU, HGBUR, BILIRUBINUR, KETONESUR, PROTEINUR, UROBILINOGEN, NITRITE, LEUKOCYTESUR   STUDIES: No results found.   ELIGIBLE FOR AVAILABLE RESEARCH PROTOCOL: no  ASSESSMENT: 75 y.o. [place] woman status post right breast upper outer quadrant biopsy 01/13/2021 for a clinical T2 N0, stage IB-IIA invasive ductal carcinoma, grade 2/3, estrogen and progesterone receptor positive, HER2 not amplified, with an MIB-1 of 15%  (A) biopsy of a separate area in the right breast upper outer quadrant 02/06/2021 showed no evidence of malignancy.  (1) right lumpectomy and sentinel lymph node sampling on 02/17/2021 showed a pT2 pN0, stage IB invasive ductal carcinoma, grade 2, with negative  margins  (A) a total of 3 right axillary sentinel lymph nodes were removed.  (2) Oncotype score of 12 predicted a risk of recurrence outside the breast within the next 9 years of 3% if the patient's only systemic therapy is antiestrogens for 5 years.  It also predicted no significant benefit from chemotherapy.  (3) adjuvant radiation 03/27/2021 through 04/24/2021  (4) antiestrogens to follow the completion of local treatment  (A) bone density 05/19/2005-- repeat 04/24/2021  (B) used oral contraceptives several years with no complications  (5) anastrozole to start 05/08/2021   PLAN: Shannah did very well with her surgery and she is tolerating  radiation with no unusual side effects.  She will complete radiation treatments later this week.  She is also benefiting from physical therapy.  She understands that there may be some breast swelling for some time after the completion of local treatment.  She will be ready to start antiestrogens after radiation and we had previously discussed the choice between anastrozole and tamoxifen.  We reviewed that today and she has a very good understanding of the possible toxicities side effects and complications of that drug.  The plan is for her to start anastrozole 05/08/2021.  She is already scheduled for bone density before the end of this month.  She will return to see [place] late January.  At that time if she is tolerating anastrozole well the plan will be to continue it for 5 years.  Otherwise there will be various alternatives that she can discuss.  Her bone density can also be discussed at that time  She may have her new baseline mammogram anytime after February.  Total encounter time 25 minutes.Sarajane Jews C. Cherita Hebel, MD 04/21/2021 3:15 PM Medical Oncology and Hematology [facility] [place], [place] [address] Tel. [phone]    Fax. [phone]   This document serves as a record of services personally performed  by Lurline Del, MD. It was created on his behalf by Wilburn Mylar, a trained medical scribe. The creation of this record is based on the scribe's personal observations and the provider's statements to them.   I, Lurline Del MD, have reviewed the above documentation for accuracy and completeness, and I agree with the above.   *Total Encounter Time as defined by the Centers for Medicare and Medicaid Services includes, in addition to the face-to-face time of a patient visit (documented in the note above) non-face-to-face time: obtaining and reviewing outside history, ordering and reviewing medications, tests or procedures, care coordination (communications with other health care professionals or caregivers) and documentation in the medical record.

## 2021-04-21 ENCOUNTER — Other Ambulatory Visit: Payer: Self-pay

## 2021-04-21 ENCOUNTER — Ambulatory Visit: Payer: Medicare Other

## 2021-04-21 ENCOUNTER — Inpatient Hospital Stay: Payer: Medicare Other | Attending: Oncology | Admitting: Oncology

## 2021-04-21 ENCOUNTER — Telehealth: Payer: Self-pay | Admitting: *Deleted

## 2021-04-21 VITALS — BP 142/75 | HR 84 | Temp 97.9°F | Resp 18 | Ht 68.0 in | Wt 204.7 lb

## 2021-04-21 DIAGNOSIS — Z170 Estrogen receptor positive status [ER+]: Secondary | ICD-10-CM | POA: Diagnosis not present

## 2021-04-21 DIAGNOSIS — R293 Abnormal posture: Secondary | ICD-10-CM

## 2021-04-21 DIAGNOSIS — M79601 Pain in right arm: Secondary | ICD-10-CM

## 2021-04-21 DIAGNOSIS — R600 Localized edema: Secondary | ICD-10-CM | POA: Diagnosis not present

## 2021-04-21 DIAGNOSIS — Z510 Encounter for antineoplastic radiation therapy: Secondary | ICD-10-CM | POA: Diagnosis not present

## 2021-04-21 DIAGNOSIS — C50411 Malignant neoplasm of upper-outer quadrant of right female breast: Secondary | ICD-10-CM | POA: Diagnosis not present

## 2021-04-21 DIAGNOSIS — M25611 Stiffness of right shoulder, not elsewhere classified: Secondary | ICD-10-CM

## 2021-04-21 DIAGNOSIS — Z483 Aftercare following surgery for neoplasm: Secondary | ICD-10-CM

## 2021-04-21 MED ORDER — ANASTROZOLE 1 MG PO TABS: 1.0000 mg | ORAL_TABLET | Freq: Every day | ORAL | 4 refills | Status: DC

## 2021-04-21 NOTE — Telephone Encounter (Signed)
CALLED PATIENT TO INFORM THAT FU APPT. HAS BEEN MOVED FROM 06-05-21 TO 06-12-21 @ 10:15 AM, LVM FOR A RETURN CALL

## 2021-04-21 NOTE — Therapy (Signed)
[facility] @ [address] [place], [place], [address] Phone: [phone]   Fax:  [phone]  Physical Therapy Treatment  Patient Details  Name: Chelsea Lee MRN: [id number] Date of Birth: Apr 25, 1946 Referring Provider (PT): Dr. Coralie Keens   Encounter Date: 04/21/2021   PT End of Session - 04/21/21 0959     Visit Number 11    Number of Visits 13    Date for PT Re-Evaluation 04/28/21    PT Start Time 0909   pt late   PT Stop Time 0955    PT Time Calculation (min) 46 min    Activity Tolerance Patient tolerated treatment well    Behavior During Therapy [facility] for tasks assessed/performed             Past Medical History:  Diagnosis Date   Anxiety    Breast cancer ([place])    Cervical radiculopathy    CKD (chronic kidney disease)    stage 3   Hearing loss    HLD (hyperlipidemia)    Obesity    Osteoarthritis     Past Surgical History:  Procedure Laterality Date   BREAST BIOPSY Left    BREAST LUMPECTOMY WITH RADIOACTIVE SEED AND SENTINEL LYMPH NODE BIOPSY Right 02/17/2021   Procedure: RIGHT BREAST LUMPECTOMY WITH RADIOACTIVE SEED AND SENTINEL LYMPH NODE BIOPSY;  Surgeon: Coralie Keens, MD;  Location: [facility];  Service: General;  Laterality: Right;   FOOT SURGERY Right    MENISCUS REPAIR Right 06/08/2006    There were no vitals filed for this visit.   Subjective Assessment - 04/21/21 0910     Subjective I have been wearing the compression bra.  I can't tell if swelling is any better or not, but it does feel a little softer afterwards.    Pertinent History Patient was diagnosed on 11/11/2020 with right grade II-III invasive ductal carcinoma breast cancer. She underwent a right lumpectomy and sentinel node biopsy ( 3 negative nodes) on 02/17/2021.  It is ER/PR positive and HER2 negative with a Ki67 of 15%. She had a right foot fusion in 2019 due to osteoarthritis.    Patient Stated Goals  See if my arm is doing ok    Currently in Pain? No/denies    Pain Score 0-No pain                               [facility] Adult PT Treatment/Exercise - 04/21/21 0001       Manual Therapy   Manual Therapy Soft tissue mobilization;Manual Lymphatic Drainage (MLD)    Edema Management Chip pack made for area of fibrosis as pt threw out the other    Soft tissue mobilization to R pec and area of fibrosis surrounding lumpectomy scar    Manual Lymphatic Drainage (MLD) In Supine: Short neck, 5 breaths , Rt inguinal and Lt axillary nodes,  anterior inter-axillary and Rt axillo-inguinal anastomosis, and focus on right breast and retracing all pathways and ending with LN.                          PT Long Term Goals - 04/16/21 1014       PT LONG TERM GOAL #1   Title Patient will demonstrate she has regained full shoulder ROM and function post operatively compared to baselines.    Time 4    Period Weeks  Status Achieved      PT LONG TERM GOAL #2   Title Patient will report >/= 25% less painin her right arm to tolerate daily tasks with greater ease.    Baseline 04/16/21- 100% improved    Period Weeks    Status Achieved      PT LONG TERM GOAL #3   Title Patient will report >/= 50% reduction in right breast edema.    Baseline 04/14/21- pt reports her swelling has reduced some; 04/16/21- 25% improved    Time 4    Period Weeks    Status On-going      PT LONG TERM GOAL #4   Title Patient will improve her DASH score to be </= 4.55 for improved arm function.    Baseline 04/16/21- 0    Time 4    Period Weeks    Status Achieved      PT LONG TERM GOAL #5   Title Patient will increase right shoulder flexion to >/= 150 degrees and abduction to >/= 160 degrees for increased ease reaching.    Baseline 04/16/21- flexion: 155, abduction: 173    Time 4    Period Weeks    Status Achieved                   Plan - 04/21/21 1000     Clinical Impression  Statement made a new chip pack for area of fibrosis Right breast and continued soft tissue mobilization and MLD to right breast.  Area of fibrosis definitely softens with manual work.  Lateral breast still appears to be the most swollen.  Skin is sightly red from radiation.  She will have her last 4 sessions of radiation this week.  She would like to have a pump trial in January when she returns from [employer] [place]    Stability/Clinical Decision Making Stable/Uncomplicated    Rehab Potential Excellent    PT Frequency 2x / week    PT Duration 4 weeks    PT Treatment/Interventions ADLs/Self Care Home Management;Therapeutic exercise;Patient/family education;Manual techniques;Manual lymph drainage;Passive range of motion;Scar mobilization    PT Next Visit Plan cont MLD to R breast, pt will be on hold for the month of Dec and will come back in Jan for reassess to see how her swelling is and to repeat SOZO    PT Home Exercise Plan Closed chain exercises; neural stretching, self MLD    Consulted and Agree with Plan of Care Patient             Patient will benefit from skilled therapeutic intervention in order to improve the following deficits and impairments:  Postural dysfunction, Decreased range of motion, Impaired UE functional use, Pain, Decreased knowledge of precautions  Visit Diagnosis: Localized edema  Stiffness of right shoulder, not elsewhere classified  Aftercare following surgery for neoplasm  Abnormal posture  Malignant neoplasm of upper-outer quadrant of right breast in female, estrogen receptor positive ([place])  Pain in right arm     Problem List Patient Active Problem List   Diagnosis Date Noted   Malignant neoplasm of upper-outer quadrant of right breast in female, estrogen receptor positive ([place]) 01/20/2021   Family history of Alzheimer's disease 02/21/2019   Primary osteoarthritis of right foot 03/09/2017    Claris Pong, PT 04/21/2021, 11:04 AM  [facility] @ [address] [place], [place], [address] Phone: [phone]   Fax:  [phone]  Name: Chelsea Lee MRN: [id number]  Date of Birth: [birth year]

## 2021-04-22 ENCOUNTER — Ambulatory Visit
Admission: RE | Admit: 2021-04-22 | Discharge: 2021-04-22 | Disposition: A | Discharge status: Home / Self Care | Payer: Medicare Other | Source: Ambulatory Visit | Attending: Radiation Oncology | Admitting: Radiation Oncology

## 2021-04-22 DIAGNOSIS — Z510 Encounter for antineoplastic radiation therapy: Secondary | ICD-10-CM | POA: Diagnosis not present

## 2021-04-22 DIAGNOSIS — C50411 Malignant neoplasm of upper-outer quadrant of right female breast: Secondary | ICD-10-CM | POA: Diagnosis not present

## 2021-04-22 DIAGNOSIS — R600 Localized edema: Secondary | ICD-10-CM | POA: Diagnosis not present

## 2021-04-22 DIAGNOSIS — Z170 Estrogen receptor positive status [ER+]: Secondary | ICD-10-CM | POA: Diagnosis not present

## 2021-04-22 DIAGNOSIS — M79601 Pain in right arm: Secondary | ICD-10-CM | POA: Diagnosis not present

## 2021-04-22 DIAGNOSIS — M25611 Stiffness of right shoulder, not elsewhere classified: Secondary | ICD-10-CM | POA: Diagnosis not present

## 2021-04-23 ENCOUNTER — Ambulatory Visit
Admission: RE | Admit: 2021-04-23 | Discharge: 2021-04-23 | Disposition: A | Discharge status: Home / Self Care | Payer: Medicare Other | Source: Ambulatory Visit | Attending: Radiation Oncology | Admitting: Radiation Oncology

## 2021-04-23 ENCOUNTER — Other Ambulatory Visit: Payer: Self-pay

## 2021-04-23 DIAGNOSIS — R600 Localized edema: Secondary | ICD-10-CM | POA: Diagnosis not present

## 2021-04-23 DIAGNOSIS — C50411 Malignant neoplasm of upper-outer quadrant of right female breast: Secondary | ICD-10-CM | POA: Diagnosis not present

## 2021-04-23 DIAGNOSIS — Z170 Estrogen receptor positive status [ER+]: Secondary | ICD-10-CM | POA: Diagnosis not present

## 2021-04-23 DIAGNOSIS — Z510 Encounter for antineoplastic radiation therapy: Secondary | ICD-10-CM | POA: Diagnosis not present

## 2021-04-23 DIAGNOSIS — M25611 Stiffness of right shoulder, not elsewhere classified: Secondary | ICD-10-CM | POA: Diagnosis not present

## 2021-04-23 DIAGNOSIS — M79601 Pain in right arm: Secondary | ICD-10-CM | POA: Diagnosis not present

## 2021-04-24 ENCOUNTER — Ambulatory Visit
Admission: RE | Admit: 2021-04-24 | Discharge: 2021-04-24 | Disposition: A | Discharge status: Home / Self Care | Payer: Medicare Other | Source: Ambulatory Visit | Attending: Oncology | Admitting: Oncology

## 2021-04-24 ENCOUNTER — Ambulatory Visit: Payer: Medicare Other

## 2021-04-24 ENCOUNTER — Encounter: Payer: Self-pay | Admitting: *Deleted

## 2021-04-24 ENCOUNTER — Ambulatory Visit
Admission: RE | Admit: 2021-04-24 | Discharge: 2021-04-24 | Disposition: A | Discharge status: Home / Self Care | Payer: Medicare Other | Source: Ambulatory Visit | Attending: Radiation Oncology | Admitting: Radiation Oncology

## 2021-04-24 ENCOUNTER — Other Ambulatory Visit: Payer: Self-pay

## 2021-04-24 DIAGNOSIS — R600 Localized edema: Secondary | ICD-10-CM | POA: Diagnosis not present

## 2021-04-24 DIAGNOSIS — M8008XA Age-related osteoporosis with current pathological fracture, vertebra(e), initial encounter for fracture: Secondary | ICD-10-CM

## 2021-04-24 DIAGNOSIS — M79601 Pain in right arm: Secondary | ICD-10-CM | POA: Diagnosis not present

## 2021-04-24 DIAGNOSIS — Z780 Asymptomatic menopausal state: Secondary | ICD-10-CM | POA: Diagnosis not present

## 2021-04-24 DIAGNOSIS — Z510 Encounter for antineoplastic radiation therapy: Secondary | ICD-10-CM | POA: Diagnosis not present

## 2021-04-24 DIAGNOSIS — C50411 Malignant neoplasm of upper-outer quadrant of right female breast: Secondary | ICD-10-CM | POA: Diagnosis not present

## 2021-04-24 DIAGNOSIS — Z170 Estrogen receptor positive status [ER+]: Secondary | ICD-10-CM | POA: Diagnosis not present

## 2021-04-24 DIAGNOSIS — M25611 Stiffness of right shoulder, not elsewhere classified: Secondary | ICD-10-CM | POA: Diagnosis not present

## 2021-04-25 ENCOUNTER — Encounter: Payer: Self-pay | Admitting: Physical Therapy

## 2021-04-25 ENCOUNTER — Encounter: Payer: Self-pay | Admitting: Radiation Oncology

## 2021-04-25 ENCOUNTER — Ambulatory Visit
Admission: RE | Admit: 2021-04-25 | Discharge: 2021-04-25 | Disposition: A | Discharge status: Home / Self Care | Payer: Medicare Other | Source: Ambulatory Visit | Attending: Radiation Oncology | Admitting: Radiation Oncology

## 2021-04-25 ENCOUNTER — Ambulatory Visit: Payer: Medicare Other | Admitting: Physical Therapy

## 2021-04-25 DIAGNOSIS — M25611 Stiffness of right shoulder, not elsewhere classified: Secondary | ICD-10-CM

## 2021-04-25 DIAGNOSIS — R600 Localized edema: Secondary | ICD-10-CM

## 2021-04-25 DIAGNOSIS — R293 Abnormal posture: Secondary | ICD-10-CM

## 2021-04-25 DIAGNOSIS — Z483 Aftercare following surgery for neoplasm: Secondary | ICD-10-CM

## 2021-04-25 DIAGNOSIS — Z510 Encounter for antineoplastic radiation therapy: Secondary | ICD-10-CM | POA: Diagnosis not present

## 2021-04-25 DIAGNOSIS — M79601 Pain in right arm: Secondary | ICD-10-CM | POA: Diagnosis not present

## 2021-04-25 DIAGNOSIS — Z170 Estrogen receptor positive status [ER+]: Secondary | ICD-10-CM | POA: Diagnosis not present

## 2021-04-25 DIAGNOSIS — C50411 Malignant neoplasm of upper-outer quadrant of right female breast: Secondary | ICD-10-CM | POA: Diagnosis not present

## 2021-04-25 NOTE — Therapy (Signed)
[facility] @ [address] [place], [place], [address] Phone: [phone]   Fax:  [phone]  Physical Therapy Treatment  Patient Details  Name: Chelsea Lee MRN: [id number] Date of Birth: May 20, 1946 Referring Provider (PT): Dr. Coralie Keens   Encounter Date: 04/25/2021   PT End of Session - 04/25/21 1050     Visit Number 12    Number of Visits 13    Date for PT Re-Evaluation 04/28/21    PT Start Time 1000    PT Stop Time 1048    PT Time Calculation (min) 48 min    Activity Tolerance Patient tolerated treatment well    Behavior During Therapy [facility] for tasks assessed/performed             Past Medical History:  Diagnosis Date   Anxiety    Breast cancer ([place])    Cervical radiculopathy    CKD (chronic kidney disease)    stage 3   Hearing loss    HLD (hyperlipidemia)    Obesity    Osteoarthritis     Past Surgical History:  Procedure Laterality Date   BREAST BIOPSY Left    BREAST LUMPECTOMY WITH RADIOACTIVE SEED AND SENTINEL LYMPH NODE BIOPSY Right 02/17/2021   Procedure: RIGHT BREAST LUMPECTOMY WITH RADIOACTIVE SEED AND SENTINEL LYMPH NODE BIOPSY;  Surgeon: Coralie Keens, MD;  Location: [facility];  Service: General;  Laterality: Right;   FOOT SURGERY Right    MENISCUS REPAIR Right 06/08/2006    There were no vitals filed for this visit.   Subjective Assessment - 04/25/21 1049     Subjective "my last radiation treatment was today!" pt is leaving for [place] for 4 weeks and feels that she knows what she needs to do to take care of herself while she is there.  Her daughter will be there most ofthe time to help and she will be walking alot.    Pertinent History Patient was diagnosed on 11/11/2020 with right grade II-III invasive ductal carcinoma breast cancer. She underwent a right lumpectomy and sentinel node biopsy ( 3 negative nodes) on 02/17/2021.  It is ER/PR positive and HER2  negative with a Ki67 of 15%. She had a right foot fusion in 2019 due to osteoarthritis.    Patient Stated Goals See if my arm is doing ok    Currently in Pain? No/denies                [facility] PT Assessment - 04/25/21 0001       AROM   Right Shoulder Flexion 165 Degrees    Right Shoulder ABduction 170 Degrees                           [facility] Adult PT Treatment/Exercise - 04/25/21 0001       Exercises   Exercises Shoulder      Shoulder Exercises: Sidelying   ABduction AROM;Right;10 reps    Other Sidelying Exercises small circles with hand pointed to ceiling      Manual Therapy   Manual Therapy Manual Lymphatic Drainage (MLD)    Edema Management added small dotted peach foam on white foam with caution for pt to monitor skin to wear at superior porion of breast    Manual Lymphatic Drainage (MLD) In Supine: Short neck, 5 breaths , Rt inguinal and Lt axillary nodes,  anterior inter-axillary and Rt axillo-inguinal anastomosis, and focus on right breast  and retracing all pathways and ending with LN. then to sidelying for posterior interaxillary anastamosis and self MLD in this position to lateral breast and trunk   avoided red areas                         PT Long Term Goals - 04/25/21 1053       PT LONG TERM GOAL #1   Title Patient will demonstrate she has regained full shoulder ROM and function post operatively compared to baselines.    Status Achieved      PT LONG TERM GOAL #2   Title Patient will report >/= 25% less painin her right arm to tolerate daily tasks with greater ease.    Status Achieved      PT LONG TERM GOAL #3   Title Patient will report >/= 50% reduction in right breast edema.    Time 4    Period Weeks    Status On-going      PT LONG TERM GOAL #4   Title Patient will improve her DASH score to be </= 4.55 for improved arm function.    Status Achieved      PT LONG TERM GOAL #5   Title Patient will increase right shoulder  flexion to >/= 150 degrees and abduction to >/= 160 degrees for increased ease reaching.    Status Achieved                   Plan - 04/25/21 1050     Clinical Impression Statement Pt is doing well with shoulder ROM and self MLD. She has some redness from radiation, but no open areas and will be monitoring her skin. She is benefitting from chip pack inside her compression bra and will continue to wear that. She had an appointment in January for reassessment and Flexitouch if still needed.  She plans to continue with 2 times a week aqua aerobics when she returns.    Stability/Clinical Decision Making Stable/Uncomplicated    Rehab Potential Excellent    PT Frequency 2x / week    PT Duration 4 weeks    PT Treatment/Interventions ADLs/Self Care Home Management;Therapeutic exercise;Patient/family education;Manual techniques;Manual lymph drainage;Passive range of motion;Scar mobilization    PT Next Visit Plan reassess to see how her swelling is and to repeat SOZO recert if needed at that time             Patient will benefit from skilled therapeutic intervention in order to improve the following deficits and impairments:  Postural dysfunction, Decreased range of motion, Impaired UE functional use, Pain, Decreased knowledge of precautions  Visit Diagnosis: Localized edema  Stiffness of right shoulder, not elsewhere classified  Aftercare following surgery for neoplasm  Abnormal posture     Problem List Patient Active Problem List   Diagnosis Date Noted   Malignant neoplasm of upper-outer quadrant of right breast in female, estrogen receptor positive ([place]) 01/20/2021   Family history of Alzheimer's disease 02/21/2019   Primary osteoarthritis of right foot 03/09/2017   Donato Heinz. Owens Shark PT  Norwood Levo, PT 04/25/2021, 10:55 AM  [facility] @ [address] [place], [place], [address] Phone: [phone]   Fax:   [phone]  Name: Chelsea Lee MRN: [id number] Date of Birth: April 20, 1946

## 2021-05-06 ENCOUNTER — Telehealth: Payer: Self-pay | Admitting: Oncology

## 2021-05-06 NOTE — Telephone Encounter (Signed)
[place] 11/14 , pt aware

## 2021-05-26 ENCOUNTER — Telehealth: Payer: Self-pay

## 2021-05-26 ENCOUNTER — Ambulatory Visit: Payer: Self-pay | Admitting: Radiation Oncology

## 2021-05-26 NOTE — Telephone Encounter (Signed)
Pt called and states she has had diarrhea since 12/16 and started anastrozole 12/1. When asked about stools, pt reports since 12/16 she has been having 2 loose stools daily which she has not been used to. Pt denies taking immodium but reports she took pepto. Advised pt to take immodium and educated on use of immodium. Pt knows to call if sx do not subside.

## 2021-05-28 ENCOUNTER — Telehealth: Payer: Medicare Other | Admitting: Oncology

## 2021-05-30 ENCOUNTER — Telehealth: Payer: Self-pay | Admitting: Hematology and Oncology

## 2021-05-30 NOTE — Telephone Encounter (Signed)
Scheduled appointment per provider. Patient aware.  

## 2021-06-03 ENCOUNTER — Telehealth: Payer: Self-pay

## 2021-06-03 NOTE — Telephone Encounter (Signed)
Pt called and states she stopped Anastrozole and is still having diarrhea. Pt states some days she is taking up to 6 immodium with no relief. Pt asks what may be causing her diarrhea since she is still having it even though stopping anastrozole. Pt has appt scheduled with Dr Chryl Heck 1/16 and asks if she can be seen sooner.

## 2021-06-04 ENCOUNTER — Telehealth: Payer: Self-pay | Admitting: Hematology and Oncology

## 2021-06-04 NOTE — Telephone Encounter (Signed)
Scheduled appointment per 12/28 staff message. Patient aware.

## 2021-06-04 NOTE — Telephone Encounter (Signed)
I sent a message to [facility] to see if we can get her in next week. Dr Jana Hakim saw her for this a couple of weeks ago. He didn't think it was Armidex related either.

## 2021-06-05 ENCOUNTER — Ambulatory Visit: Payer: Medicare Other | Admitting: Radiation Oncology

## 2021-06-06 ENCOUNTER — Encounter: Payer: Self-pay | Admitting: Radiation Oncology

## 2021-06-09 NOTE — Progress Notes (Signed)
[facility]  [phone] [phone]) [phone]    ID: SORCHA ROTUNNO DOB: 1945-11-07  MR#: [id number]  QDI#:[id number]  Patient Care Team: Velna Hatchet, MD as PCP - General (Internal Medicine) Crist Infante, MD as Consulting Physician (Internal Medicine) Mauro Kaufmann, RN as Oncology Nurse Navigator Rockwell Germany, RN as Oncology Nurse Navigator Coralie Keens, MD as Consulting Physician (General Surgery) Magrinat, Virgie Dad, MD as Consulting Physician (Oncology) Gery Pray, MD as Consulting Physician (Radiation Oncology) Harriett Sine, MD as Consulting Physician (Dermatology) Melrose Nakayama, MD as Consulting Physician (Orthopedic Surgery) Tyler Pita, MD as Referring Physician (Otolaryngology) Benay Pike, MD OTHER MD:  CHIEF COMPLAINT: Estrogen receptor positive breast cancer  CURRENT TREATMENT: Completing adjuvant radiation; to start anastrozole  INTERVAL HISTORY:  Daana returns today for follow-up of her breast cancer accompanied by herself. She still continues to have explosive diarrhea at least once a day, cant tell when its going to happen. She cant  think of any food that causes the diarrhea. She complains of some abdominal pain in both lower quadrants after she has the bowel movement. No nausea or vomiting. She lost 7 lbs in one month. Completed radiation in November Started taking arimidex in first week of December. Bone density screening showed no evidence of osteopenia/osteoporosis.  REVIEW OF SYSTEMS:  A detailed review of systems today was otherwise stable.   COVID 19 VACCINATION STATUS: Pfizer x5 as of November 2022   HISTORY OF CURRENT ILLNESS: From the original intake note:  Chelsea Lee had routine screening mammography on 11/11/2020 showing a possible abnormality in the bilateral breasts. She underwent bilateral diagnostic mammography with tomography and right breast ultrasonography at [facility]  on 01/08/2021 showing: breast density category C; 2.6 cm posterior upper-outer right breast mass; no abnormal right axillary lymph nodes; more likely benign complex cystic changes within inner right breast; benign left breast calcifications.  Accordingly on 01/13/2021 she proceeded to biopsy of the right breast area in question. The pathology from the 11 o'clock mass ([id number]) showed: invasive ductal carcinoma, grade 2-3; ductal carcinoma in situ with necrosis. Prognostic indicators significant for: estrogen receptor, >95% positive and progesterone receptor, >95% positive, both with strong staining intensity. Proliferation marker Ki67 at 15%. HER2 equivocal by immunohistochemistry (2+), but negative by fluorescent in situ hybridization with a signals ratio 1.3 and number per cell 1.75.  The area in the right breast at 2:30 was biopsied at the same time showing fibrocystic change with apocrine metaplasia.   Cancer Staging  Malignant neoplasm of upper-outer quadrant of right breast in female, estrogen receptor positive ([place]) Staging form: Breast, AJCC 8th Edition - Clinical stage from 01/22/2021: Stage IB (cT2, cN0, cM0, G2, ER+, PR+, HER2-) - Signed by Chauncey Cruel, MD on 01/22/2021 Stage prefix: Initial diagnosis Histologic grading system: 3 grade system   The patient's subsequent history is as detailed below.   PAST MEDICAL HISTORY: Past Medical History:  Diagnosis Date   Anxiety    Breast cancer ([place])    Cervical radiculopathy    CKD (chronic kidney disease)    stage 3   Hearing loss    History of radiation therapy    Right breast- 03/27/21-04/25/21- Dr. Gery Pray   HLD (hyperlipidemia)    Obesity    Osteoarthritis   She reports a history of skin cancer, treated with 19 fractions of radiation therapy while living in [place] (Dr. Juanito Doom)   PAST SURGICAL HISTORY: Past Surgical History:  Procedure Laterality Date  BREAST BIOPSY Left    BREAST LUMPECTOMY WITH RADIOACTIVE  SEED AND SENTINEL LYMPH NODE BIOPSY Right 02/17/2021   Procedure: RIGHT BREAST LUMPECTOMY WITH RADIOACTIVE SEED AND SENTINEL LYMPH NODE BIOPSY;  Surgeon: Coralie Keens, MD;  Location: [facility];  Service: General;  Laterality: Right;   FOOT SURGERY Right    MENISCUS REPAIR Right 06/08/2006    FAMILY HISTORY: Family History  Problem Relation Age of Onset   Diabetes Mother    Dementia Mother    Heart disease Father        age 39 death   Alcohol abuse Father    Her father died at age 17 from [place]. Her mother died at age 15. Lynzi has three brothers (and no sisters). There is no family history of cancer to her knowledge.   GYNECOLOGIC HISTORY:  No LMP recorded. Patient is postmenopausal. Menarche: 76 years old Age at first live birth: 76 years old [employer] P 2 LMP date unsure Contraceptive: used from age "32 to ?"  (Several years) HRT never used  Hysterectomy? no BSO? 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 Husband [name] is a Clinical biochemist. She lives at home with [name]; they have no pets. Son Excelsior, age 20, is self employed and lives in [place], [place]. Daughter [name]," age 59, is a Doctor, hospital for [employer] in [place], [place]. Marylu attends [employer].    ADVANCED DIRECTIVES: In the absence of any documentation to the contrary, the patient's spouse is their HCPOA.    HEALTH MAINTENANCE: Social History   Tobacco Use   Smoking status: Never   Smokeless tobacco: Never  Substance Use Topics   Alcohol use: Yes    Alcohol/week: 0.0 standard drinks    Comment: social   Drug use: No     Colonoscopy: yes, done in [place]  PAP: date unsure  Bone density: Nov 2022  No Known Allergies  Current Outpatient Medications  Medication Sig Dispense Refill   ALPRAZolam (XANAX) 1 MG tablet Take 1 tablet (1 mg total) by mouth 3 (three) times daily as needed for anxiety. 30  tablet 0   anastrozole (ARIMIDEX) 1 MG tablet Take 1 tablet (1 mg total) by mouth daily. Start May 08, 2021 90 tablet 4   anastrozole (ARIMIDEX) 1 MG tablet Take 1 tablet (1 mg total) by mouth daily. 90 tablet 4   Apoaequorin (PREVAGEN PO) Take 1 tablet by mouth daily.     celecoxib (CELEBREX) 200 MG capsule Take 200 mg by mouth 2 (two) times daily.     cholecalciferol (VITAMIN D3) 25 MCG (1000 UNIT) tablet Take 1 tablet (1,000 Units total) by mouth daily.     gabapentin (NEURONTIN) 300 MG capsule Take 1 capsule (300 mg total) by mouth 2 (two) times daily. 270 capsule 3   lubiprostone (AMITIZA) 8 MCG capsule Take 1 capsule by mouth 2 (two) times daily.     simvastatin (ZOCOR) 40 MG tablet Take 1 tablet (40 mg total) by mouth at bedtime. 30 tablet    No current facility-administered medications for this visit.    OBJECTIVE: White woman who appears  There were no vitals filed for this visit.     There is no height or weight on file to calculate BMI.   Wt Readings from Last 3 Encounters:  04/21/21 204 lb 11.2 oz (92.9 kg)  03/26/21 204 lb 9.6 oz (92.8 kg)  03/17/21 204  lb 12.8 oz (92.9 kg)      ECOG FS:1 - Symptomatic but completely ambulatory  Sclerae unicteric, EOMs intact Wearing a mask No cervical or supraclavicular adenopathy Lungs no rales or rhonchi Heart regular rate and rhythm Abd soft, nontender, positive bowel sounds MSK no focal spinal tenderness, no upper extremity lymphedema Neuro: nonfocal, well oriented, appropriate affect  LAB RESULTS:  CMP     Component Value Date/Time   NA 142 01/22/2021 0825   K 4.4 01/22/2021 0825   CL 109 01/22/2021 0825   CO2 22 01/22/2021 0825   GLUCOSE 160 (H) 01/22/2021 0825   BUN 24 (H) 01/22/2021 0825   CREATININE 1.25 (H) 01/22/2021 0825   CALCIUM 9.0 01/22/2021 0825   PROT 6.5 01/22/2021 0825   ALBUMIN 3.9 01/22/2021 0825   AST 25 01/22/2021 0825   ALT 29 01/22/2021 0825   ALKPHOS 99 01/22/2021 0825   BILITOT 0.5  01/22/2021 0825   GFRNONAA 44 (L) 01/22/2021 0825    No results found for: TOTALPROTELP, ALBUMINELP, A1GS, A2GS, BETS, BETA2SER, GAMS, MSPIKE, SPEI  Lab Results  Component Value Date   WBC 6.3 01/22/2021   NEUTROABS 3.2 01/22/2021   HGB 12.7 01/22/2021   HCT 39.2 01/22/2021   MCV 98.5 01/22/2021   PLT 151 01/22/2021    No results found for: LABCA2  No components found for: DUKGUR427  No results for input(s): INR in the last 168 hours.  No results found for: LABCA2  No results found for: CWC376  No results found for: EGB151  No results found for: VOH607  No results found for: CA2729  No components found for: HGQUANT  No results found for: CEA1 / No results found for: CEA1   No results found for: AFPTUMOR  No results found for: CHROMOGRNA  No results found for: KPAFRELGTCHN, LAMBDASER, KAPLAMBRATIO (kappa/lambda light chains)  No results found for: HGBA, HGBA2QUANT, HGBFQUANT, HGBSQUAN (Hemoglobinopathy evaluation)   No results found for: LDH  No results found for: IRON, TIBC, IRONPCTSAT (Iron and TIBC)  No results found for: FERRITIN  Urinalysis No results found for: COLORURINE, APPEARANCEUR, LABSPEC, PHURINE, GLUCOSEU, HGBUR, BILIRUBINUR, KETONESUR, PROTEINUR, UROBILINOGEN, NITRITE, LEUKOCYTESUR   STUDIES: No results found.   ELIGIBLE FOR AVAILABLE RESEARCH PROTOCOL: no  ASSESSMENT: 76 y.o. [place] woman status post right breast upper outer quadrant biopsy 01/13/2021 for a clinical T2 N0, stage IB-IIA invasive ductal carcinoma, grade 2/3, estrogen and progesterone receptor positive, HER2 not amplified, with an MIB-1 of 15%   (A) biopsy of a separate area in the right breast upper outer quadrant 02/06/2021 showed no evidence of malignancy.  (1) right lumpectomy and sentinel lymph node sampling on 02/17/2021 showed a pT2 pN0, stage IB invasive ductal carcinoma, grade 2, with negative margins  (A) a total of 3 right axillary sentinel lymph nodes were  removed.  (2) Oncotype score of 12 predicted a risk of recurrence outside the breast within the next 9 years of 3% if the patient's only systemic therapy is antiestrogens for 5 years.  It also predicted no significant benefit from chemotherapy.  (3) adjuvant radiation 03/27/2021 through 04/24/2021  (4) antiestrogens to follow the completion of local treatment  (A) bone density 05/19/2005-- repeat 04/24/2021  (B) used oral contraceptives several years with no complications  (5) anastrozole to start 05/08/2021  Bone density is normal 04/24/2021, ok to continue Ca/Vit D supplements,   PLAN:  Continue arimidex for adjuvant antiestrogen therapy. I dont believe her diarrhea is related to arimidex. I would recommend  gastroenterology evaluation, referral placed. No clear etiology for the diarrhea.  With regards to bone density, this is normal. Encouraged ca.vit D supplementation and weight bearing exercises.  RTC in 3 months with [place].  I spent 30 minutes in the care of the patient including history, review of records and she is a transition from Dr. Jana Hakim, physical examination, counseling about diarrhea, role of antiestrogen therapy and follow-up recommendations  *Total Encounter Time as defined by the Centers for Medicare and Medicaid Services includes, in addition to the face-to-face time of a patient visit (documented in the note above) non-face-to-face time: obtaining and reviewing outside history, ordering and reviewing medications, tests or procedures, care coordination (communications with other health care professionals or caregivers) and documentation in the medical record.

## 2021-06-10 ENCOUNTER — Other Ambulatory Visit: Payer: Self-pay

## 2021-06-10 ENCOUNTER — Encounter: Payer: Self-pay | Admitting: Hematology and Oncology

## 2021-06-10 ENCOUNTER — Inpatient Hospital Stay: Payer: Medicare Other | Attending: Oncology

## 2021-06-10 ENCOUNTER — Inpatient Hospital Stay (HOSPITAL_BASED_OUTPATIENT_CLINIC_OR_DEPARTMENT_OTHER): Payer: Medicare Other | Admitting: Hematology and Oncology

## 2021-06-10 VITALS — BP 144/57 | HR 66 | Temp 96.1°F | Resp 18 | Wt 200.2 lb

## 2021-06-10 DIAGNOSIS — Z170 Estrogen receptor positive status [ER+]: Secondary | ICD-10-CM | POA: Insufficient documentation

## 2021-06-10 DIAGNOSIS — C50411 Malignant neoplasm of upper-outer quadrant of right female breast: Secondary | ICD-10-CM

## 2021-06-10 DIAGNOSIS — R197 Diarrhea, unspecified: Secondary | ICD-10-CM

## 2021-06-10 LAB — COMPREHENSIVE METABOLIC PANEL
ALT: 23 U/L (ref 0–44)
AST: 24 U/L (ref 15–41)
Albumin: 3.9 g/dL (ref 3.5–5.0)
Alkaline Phosphatase: 92 U/L (ref 38–126)
Anion gap: 6 (ref 5–15)
BUN: 21 mg/dL (ref 8–23)
CO2: 27 mmol/L (ref 22–32)
Calcium: 9.4 mg/dL (ref 8.9–10.3)
Chloride: 107 mmol/L (ref 98–111)
Creatinine, Ser: 1.18 mg/dL — ABNORMAL HIGH (ref 0.44–1.00)
GFR, Estimated: 48 mL/min — ABNORMAL LOW (ref >60)
Glucose, Bld: 141 mg/dL — ABNORMAL HIGH (ref 70–99)
Potassium: 4 mmol/L (ref 3.5–5.1)
Sodium: 140 mmol/L (ref 135–145)
Total Bilirubin: 0.4 mg/dL (ref 0.3–1.2)
Total Protein: 6.7 g/dL (ref 6.5–8.1)

## 2021-06-10 LAB — CBC WITH DIFFERENTIAL/PLATELET
Abs Immature Granulocytes: 0.02 10*3/uL (ref 0.00–0.07)
Basophils Absolute: 0 10*3/uL (ref 0.0–0.1)
Basophils Relative: 1 %
Eosinophils Absolute: 0.1 10*3/uL (ref 0.0–0.5)
Eosinophils Relative: 1 %
HCT: 36.6 % (ref 36.0–46.0)
Hemoglobin: 11.9 g/dL — ABNORMAL LOW (ref 12.0–15.0)
Immature Granulocytes: 1 %
Lymphocytes Relative: 33 %
Lymphs Abs: 1.5 10*3/uL (ref 0.7–4.0)
MCH: 31.1 pg (ref 26.0–34.0)
MCHC: 32.5 g/dL (ref 30.0–36.0)
MCV: 95.6 fL (ref 80.0–100.0)
Monocytes Absolute: 0.5 10*3/uL (ref 0.1–1.0)
Monocytes Relative: 12 %
Neutro Abs: 2.3 10*3/uL (ref 1.7–7.7)
Neutrophils Relative %: 52 %
Platelets: 187 10*3/uL (ref 150–400)
RBC: 3.83 MIL/uL — ABNORMAL LOW (ref 3.87–5.11)
RDW: 13.5 % (ref 11.5–15.5)
WBC: 4.4 10*3/uL (ref 4.0–10.5)
nRBC: 0 % (ref 0.0–0.2)

## 2021-06-11 NOTE — Progress Notes (Incomplete)
Radiation Oncology         [phone] °________________________________ ° °Patient Name: Chelsea Lee °MRN: [id number] °DOB: 01/24/1946 °Referring Physician: HOLWERDA SCOTT (Profile Not Attached) °Date of Service: 04/25/2021 °[facility] [place] ° °                                                      End Of Treatment Note ° °Diagnoses: C50.411-Malignant neoplasm of upper-outer quadrant of right female breast ° °Cancer Staging: Stage IB (cT2, cN0, cM0) Right Breast UOQ, Invasive and in-situ Ductal Carcinoma, ER+ / PR+ / Her2-, Grade 2 ° °Intent: Curative ° °Radiation Treatment Dates: 03/27/2021 through 04/25/2021 °Site Technique Total Dose (Gy) Dose per Fx (Gy) Completed Fx Beam Energies  °Breast, Right: Breast_Rt 3D 40.05/40.05 2.67 15/15 10X  °Breast, Right: Breast_Rt_Bst 3D 12/12 2 6/6 6X, 10X  ° °Narrative: The patient tolerated radiation therapy relatively well. She reports having soreness to breast as well as continued mild fatigue. Continues to have full range of motion to right arm. Reports mild swelling to breast, denies tenderness. Reports redness to right breast. She is using radiaplex as directed. ° °On physical exam, the right breast area shows some mild erythema and hyperpigmentation changes.  No skin breakdown appreciated.  ° ° °Plan: The patient will follow-up with radiation oncology in one month . ° °________________________________________________ °----------------------------------- ° °James D. Kinard, PhD, MD ° °This document serves as a record of services personally performed by James Kinard, MD. It was created on his behalf by Elisa Frazier, a trained medical scribe. The creation of this record is based on the scribe's personal observations and the provider's statements to them. This document has been checked and approved by the attending provider. ° °

## 2021-06-11 NOTE — Progress Notes (Signed)
Radiation Oncology         [phone] ________________________________  Name: Chelsea Lee MRN: [id number]  Date: 06/12/2021  DOB: [birth year]  Follow-Up Visit Note  CC: Velna Hatchet, MD  Magrinat, Virgie Dad, MD    ICD-10-CM   1. Malignant neoplasm of upper-outer quadrant of right breast in female, estrogen receptor positive ([place])  C50.411    Z17.0       Diagnosis:  Stage IB (cT2, cN0, cM0) Right Breast UOQ, Invasive and in-situ Ductal Carcinoma, ER+ / PR+ / Her2-, Grade 2  Interval Since Last Radiation: 1 month and 17 days  Intent: Curative  Radiation Treatment Dates: 03/27/2021 through 04/25/2021 Site Technique Total Dose (Gy) Dose per Fx (Gy) Completed Fx Beam Energies  Breast, Right: Breast_Rt 3D 40.05/40.05 2.67 15/15 10X  Breast, Right: Breast_Rt_Bst 3D 12/12 2 6/6 6X, 10X    Narrative:  The patient returns today for routine follow-up. The patient tolerated radiation therapy relatively well other than soreness, swelling, and redness to the right breast, as well as ongoing fatigue. Physical exam performed near the end of her treatment revealed some mild erythema and hyperpigmentation changes to the right breast and no skin breakdown. She was given radiaplex which she used as directed.     Prior to starting RT, the patient followed up with Dr. Jana Hakim on 03/26/21. During which time, antiestrogen treatment options were discussed with the patient. Following discussion of the risks and benefits, the patient agreed to antiestrogen treatment consisting of anastrozole starting on 05/08/21.                       In recent history, the patient met with Dr. Chryl Heck on 06/10/21. During which time, the patient reported ongoing  explosive diarrhea at least once a day, abdominal pain in both lower quadrants after she has a bowel movement, and a 7 lb weight loss over the past month. Pre Dr. Chryl Heck, her diarrhea is not likely related to her antiestrogens. Given so, Dr. Chryl Heck referred the  patient to gastroenterology for further evaluation.    Pertinent imaging since the patient was last seen includes:  --DXA for bone density on 04/24/21 which revealed normal findings by WHO criteria.   She has started back on Arimidex and her diarrhea is no better or worse.  She denies any significant pain within the right breast area nipple discharge or bleeding.  She denies any problems with swelling in her right arm or hand.    Allergies:  has No Known Allergies.  Meds: Current Outpatient Medications  Medication Sig Dispense Refill   diphenoxylate-atropine (LOMOTIL) 2.5-0.025 MG tablet Take 2 tablets by mouth 4 (four) times daily as needed for diarrhea or loose stools. Not to exceed 8 tablets per day 60 tablet 0   ALPRAZolam (XANAX) 1 MG tablet Take 1 tablet (1 mg total) by mouth 3 (three) times daily as needed for anxiety. 30 tablet 0   anastrozole (ARIMIDEX) 1 MG tablet Take 1 tablet (1 mg total) by mouth daily. 90 tablet 4   Apoaequorin (PREVAGEN PO) Take 1 tablet by mouth daily.     celecoxib (CELEBREX) 200 MG capsule Take 200 mg by mouth 2 (two) times daily.     cholecalciferol (VITAMIN D3) 25 MCG (1000 UNIT) tablet Take 1 tablet (1,000 Units total) by mouth daily.     gabapentin (NEURONTIN) 300 MG capsule Take 1 capsule (300 mg total) by mouth 2 (two) times daily. 270 capsule 3   lubiprostone (  AMITIZA) 8 MCG capsule Take 1 capsule by mouth 2 (two) times daily.     simvastatin (ZOCOR) 40 MG tablet Take 1 tablet (40 mg total) by mouth at bedtime. 30 tablet    No current facility-administered medications for this encounter.    Physical Findings: The patient is in no acute distress. Patient is alert and oriented.  height is 5' 8"  (1.727 m) and weight is 201 lb (91.2 kg). Her temporal temperature is 96 F (35.6 C) (abnormal). Her blood pressure is 125/67 and her pulse is 62. Her respiration is 18 and oxygen saturation is 99%. .   Lungs are clear to auscultation bilaterally. Heart has  regular rate and rhythm. No palpable cervical, supraclavicular, or axillary adenopathy. Abdomen soft, non-tender, normal bowel sounds.  Left Breast: no palpable mass, nipple discharge or bleeding. Right Breast: Mild hyperpigmentation changes noted in the breast.  Some edema in the breast.  Some induration superior to her lumpectomy cavity consistent with surgical changes.  No dominant mass appreciated in the breast nipple discharge or bleeding.   Lab Findings: Lab Results  Component Value Date   WBC 4.4 06/10/2021   HGB 11.9 (L) 06/10/2021   HCT 36.6 06/10/2021   MCV 95.6 06/10/2021   PLT 187 06/10/2021    Radiographic Findings: No results found.  Impression:  Stage IB (cT2, cN0, cM0) Right Breast UOQ, Invasive and in-situ Ductal Carcinoma, ER+ / PR+ / Her2-, Grade 2  The patient is recovering from the effects of radiation.  No evidence of recurrence on clinical exam today.  Patient has fatigue but this is likely related to her problems with diarrhea.  She has been taking significant Imodium without success.  She has been given a limited prescription for Lomotil to see if this will help with her diarrhea until she sees her gastroenterologist.  Plan: As needed follow-up in radiation oncology.  Patient will continue close follow-up with Dr. Chryl Heck.  Gastroenterology consult pending   ____________________________________  Blair Promise, PhD, MD   This document serves as a record of services personally performed by Gery Pray, MD. It was created on his behalf by Roney Mans, a trained medical scribe. The creation of this record is based on the scribe's personal observations and the provider's statements to them. This document has been checked and approved by the attending provider.

## 2021-06-12 ENCOUNTER — Encounter: Payer: Self-pay | Admitting: Radiation Oncology

## 2021-06-12 ENCOUNTER — Other Ambulatory Visit: Payer: Self-pay

## 2021-06-12 ENCOUNTER — Ambulatory Visit
Admission: RE | Admit: 2021-06-12 | Discharge: 2021-06-12 | Disposition: A | Discharge status: Home / Self Care | Payer: Medicare Other | Source: Ambulatory Visit | Attending: Radiation Oncology | Admitting: Radiation Oncology

## 2021-06-12 DIAGNOSIS — Z79899 Other long term (current) drug therapy: Secondary | ICD-10-CM | POA: Insufficient documentation

## 2021-06-12 DIAGNOSIS — Z79811 Long term (current) use of aromatase inhibitors: Secondary | ICD-10-CM | POA: Diagnosis not present

## 2021-06-12 DIAGNOSIS — Z170 Estrogen receptor positive status [ER+]: Secondary | ICD-10-CM | POA: Diagnosis not present

## 2021-06-12 DIAGNOSIS — R197 Diarrhea, unspecified: Secondary | ICD-10-CM | POA: Insufficient documentation

## 2021-06-12 DIAGNOSIS — R634 Abnormal weight loss: Secondary | ICD-10-CM | POA: Diagnosis not present

## 2021-06-12 DIAGNOSIS — Z923 Personal history of irradiation: Secondary | ICD-10-CM | POA: Diagnosis not present

## 2021-06-12 DIAGNOSIS — C50411 Malignant neoplasm of upper-outer quadrant of right female breast: Secondary | ICD-10-CM | POA: Insufficient documentation

## 2021-06-12 DIAGNOSIS — R109 Unspecified abdominal pain: Secondary | ICD-10-CM | POA: Diagnosis not present

## 2021-06-12 HISTORY — DX: Personal history of irradiation: Z92.3

## 2021-06-12 MED ORDER — DIPHENOXYLATE-ATROPINE 2.5-0.025 MG PO TABS: 2.0000 | ORAL_TABLET | Freq: Four times a day (QID) | ORAL | 0 refills | Status: DC | PRN

## 2021-06-12 NOTE — Progress Notes (Signed)
Chelsea Lee is here today for follow up post radiation to the breast.   Breast Side: Right   They completed their radiation on: 04/25/21  Does the patient complain of any of the following: Post radiation skin issues: No  Breast Tenderness: no Breast Swelling: no Lymphadema: no Range of Motion limitations: no Fatigue post radiation: Patient continues to have a low energy level.  Appetite good/fair/poor: Fair  Additional comments if applicable:  Patient reports having constant diarrhea. Patient reports taking imodium, which was not effective.  Patient to be referred to a gastroenterologist.   Vitals:   06/12/21 1029  BP: 125/67  Pulse: 62  Resp: 18  Temp: (!) 96 F (35.6 C)  TempSrc: Temporal  SpO2: 99%  Weight: 201 lb (91.2 kg)  Height: 5\' 8"  (1.727 m)

## 2021-06-16 ENCOUNTER — Ambulatory Visit: Payer: Self-pay

## 2021-06-17 ENCOUNTER — Other Ambulatory Visit: Payer: Self-pay

## 2021-06-17 ENCOUNTER — Encounter: Payer: Self-pay | Admitting: Physical Therapy

## 2021-06-17 ENCOUNTER — Ambulatory Visit: Payer: Medicare Other | Attending: Surgery | Admitting: Physical Therapy

## 2021-06-17 DIAGNOSIS — Z170 Estrogen receptor positive status [ER+]: Secondary | ICD-10-CM | POA: Diagnosis not present

## 2021-06-17 DIAGNOSIS — R600 Localized edema: Secondary | ICD-10-CM | POA: Diagnosis not present

## 2021-06-17 DIAGNOSIS — C50411 Malignant neoplasm of upper-outer quadrant of right female breast: Secondary | ICD-10-CM | POA: Diagnosis not present

## 2021-06-17 DIAGNOSIS — R293 Abnormal posture: Secondary | ICD-10-CM | POA: Diagnosis not present

## 2021-06-17 DIAGNOSIS — Z483 Aftercare following surgery for neoplasm: Secondary | ICD-10-CM

## 2021-06-17 DIAGNOSIS — M25611 Stiffness of right shoulder, not elsewhere classified: Secondary | ICD-10-CM

## 2021-06-17 NOTE — Therapy (Signed)
[facility] @ [address] [place], [place], [address] Phone: [phone]   Fax:  [phone]  Physical Therapy Treatment  Patient Details  Name: Chelsea Lee MRN: [id number] Date of Birth: 1946-01-22 Referring Provider (PT): Dr. Coralie Keens   Encounter Date: 06/17/2021   PT End of Session - 06/17/21 1051     Visit Number 13    Number of Visits 13    Date for PT Re-Evaluation 04/28/21    PT Start Time 1010    PT Stop Time 1040    PT Time Calculation (min) 30 min    Activity Tolerance Patient tolerated treatment well    Behavior During Therapy [facility] for tasks assessed/performed             Past Medical History:  Diagnosis Date   Anxiety    Breast cancer ([place])    Cervical radiculopathy    CKD (chronic kidney disease)    stage 3   Hearing loss    History of radiation therapy    Right breast- 03/27/21-04/25/21- Dr. Gery Pray   HLD (hyperlipidemia)    Obesity    Osteoarthritis     Past Surgical History:  Procedure Laterality Date   BREAST BIOPSY Left    BREAST LUMPECTOMY WITH RADIOACTIVE SEED AND SENTINEL LYMPH NODE BIOPSY Right 02/17/2021   Procedure: RIGHT BREAST LUMPECTOMY WITH RADIOACTIVE SEED AND SENTINEL LYMPH NODE BIOPSY;  Surgeon: Coralie Keens, MD;  Location: [facility];  Service: General;  Laterality: Right;   FOOT SURGERY Right    MENISCUS REPAIR Right 06/08/2006    There were no vitals filed for this visit.   Subjective Assessment - 06/17/21 1013     Subjective I saw Dr. Sondra Come and Dr. Chryl Heck. I have had diarrhea everyday since I left. I wore my compression bra most of the time while I was gone. I can not tell that my swelling has gotten worse.    Pertinent History Patient was diagnosed on 11/11/2020 with right grade II-III invasive ductal carcinoma breast cancer. She underwent a right lumpectomy and sentinel node biopsy ( 3 negative nodes) on 02/17/2021.  It is ER/PR  positive and HER2 negative with a Ki67 of 15%. She had a right foot fusion in 2019 due to osteoarthritis.    Patient Stated Goals See if my arm is doing ok    Currently in Pain? No/denies    Pain Score 0-No pain                               [facility] Adult PT Treatment/Exercise - 06/17/21 0001       Manual Therapy   Edema Management educated pt in correct direction to stretch skin for self MLD; assessed breast swelling - still fullness on R side but not signficantly changed from last session here 2 months ago, still has some fibrosis/scar tissue just above areola    Soft tissue mobilization educated pt in correct way to do STM to area of fibrosis just superior to L areola                          PT Long Term Goals - 04/25/21 1053       PT LONG TERM GOAL #1   Title Patient will demonstrate she has regained full shoulder ROM and function post operatively compared to baselines.    Status Achieved  PT LONG TERM GOAL #2   Title Patient will report >/= 25% less painin her right arm to tolerate daily tasks with greater ease.    Status Achieved      PT LONG TERM GOAL #3   Title Patient will report >/= 50% reduction in right breast edema.    Time 4    Period Weeks    Status On-going      PT LONG TERM GOAL #4   Title Patient will improve her DASH score to be </= 4.55 for improved arm function.    Status Achieved      PT LONG TERM GOAL #5   Title Patient will increase right shoulder flexion to >/= 150 degrees and abduction to >/= 160 degrees for increased ease reaching.    Status Achieved                   Plan - 06/17/21 1042     Clinical Impression Statement Pt returns to PT after performing self management of R breast lymphedema independently for approximately 2 months. Pt traveled to [place] and spent the last two months there. She reports she wore her compression bra most of the time but recently has switched back to a regular  bra. She has not noticed any changes in her breast or any increase in swelling. There is still some full in her right breast with fibrosis just superior to nipple. Educated pt to perform soft tissue mobilization to this area to soften fibrotic tissue. Educated pt in correct self MLD technique. Pt is eager to continue with her water aerobics but currently is unable to return due to her diarrhea. She has a referral to a GI doctor. At this time pt will be discharged from skilled PT services and will continue to [place] screenings every 3 months for 2 years post surgery.    PT Frequency 2x / week    PT Duration 4 weeks    PT Treatment/Interventions ADLs/Self Care Home Management;Therapeutic exercise;Patient/family education;Manual techniques;Manual lymph drainage;Passive range of motion;Scar mobilization    PT Next Visit Plan continue SOZO every 3 months for 2 years    PT Home Exercise Plan Closed chain exercises; neural stretching, self MLD    Consulted and Agree with Plan of Care Patient             Patient will benefit from skilled therapeutic intervention in order to improve the following deficits and impairments:  Postural dysfunction, Decreased range of motion, Impaired UE functional use, Pain, Decreased knowledge of precautions  Visit Diagnosis: Localized edema  Stiffness of right shoulder, not elsewhere classified  Aftercare following surgery for neoplasm  Abnormal posture  Malignant neoplasm of upper-outer quadrant of right breast in female, estrogen receptor positive ([place])     Problem List Patient Active Problem List   Diagnosis Date Noted   Malignant neoplasm of upper-outer quadrant of right breast in female, estrogen receptor positive ([place]) 01/20/2021   Family history of Alzheimer's disease 02/21/2019   Primary osteoarthritis of right foot 03/09/2017    Allyson Sabal [place], PT 06/17/2021, 10:54 AM  [facility] @ [address]  [place], [place], [address] Phone: [phone]   Fax:  [phone]  Name: Chelsea Lee MRN: [id number] Date of Birth: 08-Sep-1945   PHYSICAL THERAPY DISCHARGE SUMMARY  Visits from Start of Care: 13  Current functional level related to goals / functional outcomes: All goals met   Remaining deficits: Still some swelling  in R breast but pt able to independently manage   Education / Equipment: Self MLD, compression   Patient agrees to discharge. Patient goals were met. Patient is being discharged due to meeting the stated rehab goals.  Allyson Sabal [place], [place] 06/17/21 10:55 AM

## 2021-06-18 DIAGNOSIS — M79672 Pain in left foot: Secondary | ICD-10-CM | POA: Diagnosis not present

## 2021-06-18 DIAGNOSIS — M79671 Pain in right foot: Secondary | ICD-10-CM | POA: Diagnosis not present

## 2021-06-19 ENCOUNTER — Telehealth: Payer: Self-pay | Admitting: *Deleted

## 2021-06-23 ENCOUNTER — Ambulatory Visit: Payer: Medicare Other | Admitting: Hematology and Oncology

## 2021-06-23 ENCOUNTER — Other Ambulatory Visit: Payer: Medicare Other

## 2021-06-24 ENCOUNTER — Ambulatory Visit: Payer: Medicare Other | Admitting: Hematology and Oncology

## 2021-06-24 ENCOUNTER — Other Ambulatory Visit: Payer: Medicare Other

## 2021-06-26 ENCOUNTER — Other Ambulatory Visit: Payer: Self-pay | Admitting: Radiation Oncology

## 2021-06-26 MED ORDER — DIPHENOXYLATE-ATROPINE 2.5-0.025 MG PO TABS: 2.0000 | ORAL_TABLET | Freq: Four times a day (QID) | ORAL | 0 refills | Status: DC | PRN

## 2021-07-02 DIAGNOSIS — K5904 Chronic idiopathic constipation: Secondary | ICD-10-CM | POA: Insufficient documentation

## 2021-07-02 DIAGNOSIS — I10 Essential (primary) hypertension: Secondary | ICD-10-CM | POA: Insufficient documentation

## 2021-07-03 ENCOUNTER — Encounter: Payer: Self-pay | Admitting: Nurse Practitioner

## 2021-07-03 ENCOUNTER — Ambulatory Visit (INDEPENDENT_AMBULATORY_CARE_PROVIDER_SITE_OTHER): Payer: Medicare Other | Admitting: Nurse Practitioner

## 2021-07-03 ENCOUNTER — Other Ambulatory Visit (INDEPENDENT_AMBULATORY_CARE_PROVIDER_SITE_OTHER): Payer: Medicare Other

## 2021-07-03 VITALS — BP 120/72 | HR 62 | Ht 68.0 in | Wt 196.0 lb

## 2021-07-03 DIAGNOSIS — D649 Anemia, unspecified: Secondary | ICD-10-CM

## 2021-07-03 DIAGNOSIS — R197 Diarrhea, unspecified: Secondary | ICD-10-CM

## 2021-07-03 LAB — CBC
HCT: 38.3 % (ref 36.0–46.0)
Hemoglobin: 12.6 g/dL (ref 12.0–15.0)
MCHC: 32.8 g/dL (ref 30.0–36.0)
MCV: 93.6 fl (ref 78.0–100.0)
Platelets: 174 10*3/uL (ref 150.0–400.0)
RBC: 4.1 Mil/uL (ref 3.87–5.11)
RDW: 14.4 % (ref 11.5–15.5)
WBC: 4.4 10*3/uL (ref 4.0–10.5)

## 2021-07-03 NOTE — Patient Instructions (Signed)
LABS:  Lab work and a stool test have been ordered for you today. Our lab is located in the basement. Press "B" on the elevator. The lab is located at the first door on the left as you exit the elevator.  HEALTHCARE LAWS AND MY CHART RESULTS:  Due to recent changes in healthcare laws, you may see the results of your imaging and laboratory studies on MyChart before your provider has had a chance to review them.   We understand that in some cases there may be results that are confusing or concerning to you. Not all laboratory results come back in the same time frame and the provider may be waiting for multiple results in order to interpret others.  Please give [place] 48 hours in order for your provider to thoroughly review all the results before contacting the office for clarification of your results.   RECOMMENDATIONS:  Hold Advil until after we get all your test results back.  It was great seeing you today! Thank you for entrusting me with your care and choosing [facility]  Tye Savoy, NP  The [facility] providers would like to encourage you to use [facility] to communicate with providers for non-urgent requests or questions.  Due to long hold times on the telephone, sending your provider a message by [facility] may be faster and more efficient way to get a response. Please allow 48 business hours for a response.  Please remember that this is for non-urgent requests/questions.  If you are age 18 or older, your body mass index should be between 23-30. Your Body mass index is 29.8 kg/m. If this is out of the aforementioned range listed, please consider follow up with your Primary Care Provider.  If you are age 46 or younger, your body mass index should be between 19-25. Your Body mass index is 29.8 kg/m. If this is out of the aformentioned range listed, please consider follow up with your Primary Care Provider.

## 2021-07-03 NOTE — Progress Notes (Signed)
ASSESSMENT AND PLAN    # 76 yo female referred by Oncology for diarrhea. She has an 8 week history of watery diarrhea of unclear etiology ( no med changes, no dietary changes). Watery diarrhea started a week after completion of radiation for right breast cancer ( did not get chemotherapy). Showed me a photo on phone and stool is in fact very watery / almost transparent. Rule out infectious.  Prior to onset of watery stool she was taking Amitiza as needed for constipation and it was working well. Overflow diarrhea is not suspected.  --Stool for lactoferrin, GI path w/  C-diff --Continue lomotil TID as needed  # Mild Bland anemia.  Hgb was 12.7 in  August 2022, down to 11.9 on 06/10/21. She did have a lumpectomy in between this time period. No blood in stool except for what looks like "black beans" in stool sometimes. Takes Advil a few times a week. Very scant amount of light brown stool residual in vault which is heme negative.  --hold off on Advil until CBC results.                       # History of adenomatous colon polyps. Tiny TA removed in 2016.  Due for 7-year recall colonoscopy this August  HISTORY OF PRESENT ILLNESS     Chief Complaint : watery diarrhea  Chelsea Lee is a 76 y.o. female with a past medical history significant for adenomatous colon polyps (small polyp in 2016), mild diverticulosis, right breast cancer stage Ib status postlumpectomy /  /radiation / on Arimidex, CKD. See PMH below for any additional history.   Patient has screening colonoscopy by Dr. Ardis Hughs in 2016.  A small tubular adenoma  Chelsea Lee is referred by Oncology for diarrhea  Patient gives a history of intermittent constipation for years. She had been taking Amitiza as needed which normalized bowel movements but then she developed loose stool late November. BMs urgent. Stools are watery. BMs are unpredictable, not just related to eating. She has nocturnal stooling. No blood in stool She had a  lumpcectomy of right breast in October , started radiation afterwards. The diarrhea started about a week after completing of radiation. She did not require chemotherapy. She did started Arimidex early December ( diarrhea had already started). Also, she held Arimdiex for a week and it didn't help. She has not stopped nor started any or new mediations ( except for Lomotil)for correlate with onset with diarrhea. No antibitiocs in last few months. She is taking Lomotil which helps with frequency but not consistency. She consumes little in way of diary.   Data Reviewed:  CBC Latest Ref Rng & Units 06/10/2021 01/22/2021  WBC 4.0 - 10.5 K/uL 4.4 6.3  Hemoglobin 12.0 - 15.0 g/dL 11.9(L) 12.7  Hematocrit 36.0 - 46.0 % 36.6 39.2  Platelets 150 - 400 K/uL 187 151    No results found for: LIPASE CMP Latest Ref Rng & Units 06/10/2021 01/22/2021  Glucose 70 - 99 mg/dL 141(H) 160(H)  BUN 8 - 23 mg/dL 21 24(H)  Creatinine 0.44 - 1.00 mg/dL 1.18(H) 1.25(H)  Sodium 135 - 145 mmol/L 140 142  Potassium 3.5 - 5.1 mmol/L 4.0 4.4  Chloride 98 - 111 mmol/L 107 109  CO2 22 - 32 mmol/L 27 22  Calcium 8.9 - 10.3 mg/dL 9.4 9.0  Total Protein 6.5 - 8.1 g/dL 6.7 6.5  Total Bilirubin 0.3 - 1.2 mg/dL 0.4 0.5  Alkaline Phos  38 - 126 U/L 92 99  AST 15 - 41 U/L 24 25  ALT 0 - 44 U/L 23 29    PREVIOUS GI EVALUATIONS:   August 2016 screening colonoscopy / FOBT + --excellent prep.  Mild diverticulosis in left colon.  A 3 mm sessile polyp was removed from the transverse colon   Past Medical History:  Diagnosis Date   Anxiety    Breast cancer ([facility])    Cervical radiculopathy    CKD (chronic kidney disease)    stage 3   Hearing loss    History of radiation therapy    Right breast- 03/27/21-04/25/21- Dr. Gery Pray   HLD (hyperlipidemia)    Obesity    Osteoarthritis      Past Surgical History:  Procedure Laterality Date   BREAST BIOPSY Left    BREAST LUMPECTOMY WITH RADIOACTIVE SEED AND SENTINEL LYMPH NODE  BIOPSY Right 02/17/2021   Procedure: RIGHT BREAST LUMPECTOMY WITH RADIOACTIVE SEED AND SENTINEL LYMPH NODE BIOPSY;  Surgeon: Coralie Keens, MD;  Location: [facility];  Service: General;  Laterality: Right;   FOOT SURGERY Right    MENISCUS REPAIR Right 06/08/2006   Family History  Problem Relation Age of Onset   Diabetes Mother    Dementia Mother    Heart disease Father        age 10 death   Alcohol abuse Father    Social History   Tobacco Use   Smoking status: Never   Smokeless tobacco: Never  Substance Use Topics   Alcohol use: Yes    Alcohol/week: 0.0 standard drinks    Comment: social   Drug use: No   Current Outpatient Medications  Medication Sig Dispense Refill   ALPRAZolam (XANAX) 1 MG tablet Take 1 tablet (1 mg total) by mouth 3 (three) times daily as needed for anxiety. 30 tablet 0   anastrozole (ARIMIDEX) 1 MG tablet Take 1 tablet (1 mg total) by mouth daily. 90 tablet 4   Apoaequorin (PREVAGEN PO) Take 1 tablet by mouth daily.     celecoxib (CELEBREX) 200 MG capsule Take 200 mg by mouth 2 (two) times daily.     cholecalciferol (VITAMIN D3) 25 MCG (1000 UNIT) tablet Take 1 tablet (1,000 Units total) by mouth daily.     diphenoxylate-atropine (LOMOTIL) 2.5-0.025 MG tablet Take 2 tablets by mouth 4 (four) times daily as needed for diarrhea or loose stools. Not to exceed 8 tablets per day 60 tablet 0   gabapentin (NEURONTIN) 300 MG capsule Take 1 capsule (300 mg total) by mouth 2 (two) times daily. 270 capsule 3   lubiprostone (AMITIZA) 8 MCG capsule Take 1 capsule by mouth 2 (two) times daily.     simvastatin (ZOCOR) 40 MG tablet Take 1 tablet (40 mg total) by mouth at bedtime. 30 tablet    No current facility-administered medications for this visit.   No Known Allergies   Review of Systems: All systems reviewed and negative except where noted in HPI.    PHYSICAL EXAM :    Wt Readings from Last 3 Encounters:  06/12/21 201 lb (91.2 kg)   06/10/21 200 lb 4 oz (90.8 kg)  04/21/21 204 lb 11.2 oz (92.9 kg)    BP 120/72    Pulse 62    Ht 5\' 8"  (1.727 m)    Wt 196 lb (88.9 kg)    SpO2 97%    BMI 29.80 kg/m  Constitutional:  Generally well appearing female in no acute distress. Psychiatric:  Pleasant. Normal mood and affect. Behavior is normal. EENT: Pupils normal.  Conjunctivae are normal. No scleral icterus. Neck supple.  Cardiovascular: Normal rate, regular rhythm. No edema Pulmonary/chest: Effort normal and breath sounds normal. No wheezing, rales or rhonchi. Abdominal: Soft, nondistended, nontender. Bowel sounds active throughout. There are no masses palpable. No hepatomegaly. Neurological: Alert and oriented to person place and time. Skin: Skin is warm and dry. No rashes noted.  Tye Savoy, NP  07/03/2021, 9:00 AM  Cc:  Referring Provider Raynald Blend, MD

## 2021-07-03 NOTE — Progress Notes (Signed)
I agree with the above note, plan 

## 2021-07-04 ENCOUNTER — Other Ambulatory Visit: Payer: Medicare Other

## 2021-07-04 DIAGNOSIS — A09 Infectious gastroenteritis and colitis, unspecified: Secondary | ICD-10-CM | POA: Diagnosis not present

## 2021-07-04 DIAGNOSIS — R197 Diarrhea, unspecified: Secondary | ICD-10-CM | POA: Diagnosis not present

## 2021-07-04 DIAGNOSIS — D649 Anemia, unspecified: Secondary | ICD-10-CM

## 2021-07-04 DIAGNOSIS — H903 Sensorineural hearing loss, bilateral: Secondary | ICD-10-CM | POA: Diagnosis not present

## 2021-07-08 ENCOUNTER — Telehealth: Payer: Self-pay | Admitting: Nurse Practitioner

## 2021-07-08 LAB — GI PROFILE, STOOL, PCR

## 2021-07-08 LAB — FECAL LACTOFERRIN, QUANT
Fecal Lactoferrin: POSITIVE — AB
MICRO NUMBER:: 12930037
SPECIMEN QUALITY:: ADEQUATE

## 2021-07-08 NOTE — Telephone Encounter (Signed)
Chelsea Lee, the last of her stool test is back. You can answer through this or through the labs.

## 2021-07-08 NOTE — Telephone Encounter (Signed)
Inbound call from patient states she would like a call back to discuss next steps in treatment plan because she is still having problems.

## 2021-07-10 ENCOUNTER — Other Ambulatory Visit: Payer: Self-pay

## 2021-07-10 DIAGNOSIS — D649 Anemia, unspecified: Secondary | ICD-10-CM

## 2021-07-10 DIAGNOSIS — R197 Diarrhea, unspecified: Secondary | ICD-10-CM

## 2021-07-10 MED ORDER — SUTAB 1479-225-188 MG PO TABS: ORAL_TABLET | ORAL | 0 refills | Status: DC

## 2021-07-10 NOTE — Telephone Encounter (Signed)
Discussed with the patient. She agrees to this plan of care. No egg or soy allergy known to the patient. No issues known to the patient with past surgeries or procedures. Denies being told she had a difficult airway or is a difficult intubation. No FH of malignant hyperthermia. No diet pills, blood thinner or home oxygen. Denies any cardiac issues. Scheduled in the [facility] as per protocol. Brief review of instructions. Written instructions mailed to her as well as being able to view the instructions in My Chart. Prescription and coupon for the prep mailed to the patient. No further questions by the end of the phone call.

## 2021-07-10 NOTE — Telephone Encounter (Signed)
Patient called stating she received the results in My Chart but did not understand them.  She would like someone to call her and provide clarification.  She is also still having the issues she came in for and would like to know what to do regarding them.  Please call patient and advise.  Thank you.

## 2021-07-10 NOTE — Telephone Encounter (Signed)
-----   Message from Willia Craze, NP sent at 07/09/2021  4:11 PM EST ----- Bet;h, please let patient know that her stool studies came back negative for an infection but the lactoferrin is positive implying that this may be an inflammatory diarrhea.  If she is willing then we would like to proceed with a colonoscopy to find out why she is having the diarrhea.  In the meantime please have her take Imodium 2 mg every morning.  Make sure she stops the Imodium 1 week prior to the colonoscopy.  Thank you  ----- Message ----- From: Milus Banister, MD Sent: 07/09/2021  12:53 PM EST To: Willia Craze, NP Subject: RE: your thoughts?                             I think a colonoscopy is a good next.  I recommend she either today Lomotil or Imodium every single morning on a scheduled basis until.  Thanks ----- Message ----- From: Willia Craze, NP Sent: 07/09/2021  12:31 PM EST To: Milus Banister, MD Subject: your thoughts?                                 Hi, would like to get your thoughts on this woman.  She was doing well on Amitiza but right after completing XRT in November for breast cancer she developed acute diarrhea (didn't get chemo) Obviously stopped the Amitiza. Still with very watery stool ( I saw photos on phone). Ongoing for nearly 2 months now.    GI path negative,  lactoferrin is positive. She is taking Lomotil.    Cannot correlate watery diarrhea to foods, meds or anything else.  I was thinking about a colon on her ( last one was 2016). Other thoughts?    Thanks pg

## 2021-07-29 ENCOUNTER — Ambulatory Visit (AMBULATORY_SURGERY_CENTER): Payer: Medicare Other | Admitting: Gastroenterology

## 2021-07-29 ENCOUNTER — Encounter: Payer: Self-pay | Admitting: Gastroenterology

## 2021-07-29 VITALS — BP 150/66 | HR 59 | Temp 98.7°F | Resp 16 | Ht 68.0 in | Wt 196.0 lb

## 2021-07-29 DIAGNOSIS — K5730 Diverticulosis of large intestine without perforation or abscess without bleeding: Secondary | ICD-10-CM | POA: Diagnosis not present

## 2021-07-29 DIAGNOSIS — R197 Diarrhea, unspecified: Secondary | ICD-10-CM

## 2021-07-29 DIAGNOSIS — D649 Anemia, unspecified: Secondary | ICD-10-CM | POA: Diagnosis not present

## 2021-07-29 MED ORDER — SODIUM CHLORIDE 0.9 % IV SOLN
500.0000 mL | Freq: Once | INTRAVENOUS | Status: DC
Start: 2021-07-29 — End: 2021-07-29

## 2021-07-29 NOTE — Progress Notes (Signed)
PT taken to PACU. Monitors in place. VSS. Report given to RN. 

## 2021-07-29 NOTE — Patient Instructions (Signed)

## 2021-07-29 NOTE — Op Note (Addendum)
[facility] Patient Name: Chelsea Lee Procedure Date: 07/29/2021 9:16 AM MRN: [id number] Endoscopist: Milus Banister , MD Age: 76 Referring MD:  Date of Birth: [birth year] Gender: Female Account #: [id number] Procedure:                Colonoscopy Indications:              Chronic diarrhea, also single subCM adenoma removed                            during colonoscopy 01/2015 Medicines:                Monitored Anesthesia Care Procedure:                Pre-Anesthesia Assessment:                           - Prior to the procedure, a History and Physical                            was performed, and patient medications and                            allergies were reviewed. The patient's tolerance of                            previous anesthesia was also reviewed. The risks                            and benefits of the procedure and the sedation                            options and risks were discussed with the patient.                            All questions were answered, and informed consent                            was obtained. Prior Anticoagulants: The patient has                            taken no previous anticoagulant or antiplatelet                            agents. ASA Grade Assessment: II - A patient with                            mild systemic disease. After reviewing the risks                            and benefits, the patient was deemed in                            satisfactory condition to undergo the procedure.  After obtaining informed consent, the colonoscope                            was passed under direct vision. Throughout the                            procedure, the patient's blood pressure, pulse, and                            oxygen saturations were monitored continuously. The                            CF [id number] #[id number] was introduced through the anus                            and advanced to the the  terminal ileum. The                            colonoscopy was performed without difficulty. The                            patient tolerated the procedure well. The quality                            of the bowel preparation was good. The terminal                            ileum, ileocecal valve, appendiceal orifice, and                            rectum were photographed. Scope In: 9:26:33 AM Scope Out: 7:98:92 AM Scope Withdrawal Time: 0 hours 6 minutes 12 seconds  Total Procedure Duration: 0 hours 10 minutes 26 seconds  Findings:                 Multiple small and large-mouthed diverticula were                            found in the entire colon.                           The terminal ileum appeared normal.                           Biopsies for histology were taken with a cold                            forceps from the entire colon for evaluation of                            microscopic colitis.                           The exam was otherwise without abnormality on  direct and retroflexion views. Complications:            No immediate complications. Estimated blood loss:                            None. Estimated Blood Loss:     Estimated blood loss: none. Impression:               - Diverticulosis in the entire examined colon.                           - The examined portion of the ileum was normal.                           - The examination was otherwise normal on direct                            and retroflexion views.                           - Biopsies were taken with a cold forceps from the                            entire colon for evaluation of microscopic colitis. Recommendation:           - Patient has a contact number available for                            emergencies. The signs and symptoms of potential                            delayed complications were discussed with the                            patient. Return to normal  activities tomorrow.                            Written discharge instructions were provided to the                            patient.                           - Resume previous diet.                           - Continue present medications.                           - Await pathology results. For now please start                            taking a single OTC imodium (2mg  pills), one pill                            shortly after waking every morning on a scheduled  basis. Milus Banister, MD 07/29/2021 9:41:08 AM This report has been signed electronically.

## 2021-07-29 NOTE — Progress Notes (Signed)
°  The recent H&P (dated 07/03/2021) was reviewed, the patient was examined and there is no change in the patients condition since that H&P was completed.   Chelsea Lee  07/29/2021, 9:20 AM

## 2021-07-29 NOTE — Progress Notes (Signed)
Called to room to assist during endoscopic procedure.  Patient ID and intended procedure confirmed with present staff. Received instructions for my participation in the procedure from the performing physician.  

## 2021-07-31 ENCOUNTER — Telehealth: Payer: Self-pay

## 2021-07-31 NOTE — Telephone Encounter (Signed)
°  Follow up Call-  Call back number 07/29/2021  Post procedure Call Back phone  # [phone]  Permission to leave phone message Yes  Some recent data might be hidden     Patient questions:  Do you have a fever, pain , or abdominal swelling? No. Pain Score  0 *  Have you tolerated food without any problems? Yes.    Have you been able to return to your normal activities? Yes.    Do you have any questions about your discharge instructions: Diet   No. Medications  No. Follow up visit  No.  Do you have questions or concerns about your Care? No.  Actions: * If pain score is 4 or above: No action needed, pain <4.

## 2021-08-05 ENCOUNTER — Encounter: Payer: Self-pay | Admitting: Gastroenterology

## 2021-08-20 ENCOUNTER — Other Ambulatory Visit: Payer: Self-pay | Admitting: Hematology and Oncology

## 2021-08-20 DIAGNOSIS — Z9889 Other specified postprocedural states: Secondary | ICD-10-CM

## 2021-09-08 ENCOUNTER — Encounter: Payer: Self-pay | Admitting: *Deleted

## 2021-09-08 ENCOUNTER — Encounter: Payer: Self-pay | Admitting: Hematology and Oncology

## 2021-09-08 ENCOUNTER — Other Ambulatory Visit: Payer: Self-pay

## 2021-09-08 ENCOUNTER — Inpatient Hospital Stay (HOSPITAL_BASED_OUTPATIENT_CLINIC_OR_DEPARTMENT_OTHER): Payer: Medicare Other | Admitting: *Deleted

## 2021-09-08 ENCOUNTER — Inpatient Hospital Stay: Payer: Medicare Other | Attending: Oncology | Admitting: Hematology and Oncology

## 2021-09-08 VITALS — BP 152/79 | HR 72 | Temp 98.1°F | Resp 16 | Wt 206.6 lb

## 2021-09-08 DIAGNOSIS — C50411 Malignant neoplasm of upper-outer quadrant of right female breast: Secondary | ICD-10-CM

## 2021-09-08 DIAGNOSIS — R197 Diarrhea, unspecified: Secondary | ICD-10-CM | POA: Diagnosis not present

## 2021-09-08 DIAGNOSIS — Z170 Estrogen receptor positive status [ER+]: Secondary | ICD-10-CM | POA: Insufficient documentation

## 2021-09-08 DIAGNOSIS — N183 Chronic kidney disease, stage 3 unspecified: Secondary | ICD-10-CM | POA: Insufficient documentation

## 2021-09-08 NOTE — Progress Notes (Signed)
?Chelsea Lee  ?Telephone:([phone] [phone]  ? ? ?ID: [id number] PARDUE DOB: August 11, 1945  MR#: [id number]  CSN#:[id number] ? ?Patient Care Team: ?Velna Hatchet, MD as PCP - General (Internal Medicine) ?Crist Infante, MD as Consulting Physician (Internal Medicine) ?Coralie Keens, MD as Consulting Physician (General Surgery) ?Gery Pray, MD as Consulting Physician (Radiation Oncology) ?Harriett Sine, MD as Consulting Physician (Dermatology) ?Melrose Nakayama, MD as Consulting Physician (Orthopedic Surgery) ?Tyler Pita, MD as Referring Physician (Otolaryngology) ?Benay Pike, MD as Consulting Physician (Hematology and Oncology) ?Gardenia Phlegm, NP as Nurse Practitioner (Hematology and Oncology) ?Harmon Pier, RN as Registered Nurse ?Benay Pike, MD ?OTHER MD: ? ?CHIEF COMPLAINT: Estrogen receptor positive breast cancer ? ?CURRENT TREATMENT: Completing adjuvant radiation; to start anastrozole ? ?INTERVAL HISTORY: ? ?Chelsea Lee returns today for follow-up of her breast cancer unaccompanied. ?Diarrhea has significantly improved, now on imodium daily. She continues to have about one loose bowel movement a week. ?She was hoping to take imodium PRN at this time. ?She has been taking anastrazole every day. ?She had colonoscopy in Feb 2023, diverticulosis. ?She otherwise feels well.  She is slowly recovering.  Rest of the pertinent 10 point ROS reviewed and negative. ? ? ?REVIEW OF SYSTEMS: ? A detailed review of systems today was otherwise stable. ? ? COVID 19 VACCINATION STATUS: Pfizer x5 as of November 2022 ? ? ?HISTORY OF CURRENT ILLNESS: ?From the original intake note: ? ?Chelsea Lee had routine screening mammography on 11/11/2020 showing a possible abnormality in the bilateral breasts. She underwent bilateral diagnostic mammography with tomography and right breast ultrasonography at [facility] on 01/08/2021 showing: breast density category C; 2.6 cm  posterior upper-outer right breast mass; no abnormal right axillary lymph nodes; more likely benign complex cystic changes within inner right breast; benign left breast calcifications. ? ?Accordingly on 01/13/2021 she proceeded to biopsy of the right breast area in question. The pathology from the 11 o'clock mass ([id number]) showed: invasive ductal carcinoma, grade 2-3; ductal carcinoma in situ with necrosis. Prognostic indicators significant for: estrogen receptor, >95% positive and progesterone receptor, >95% positive, both with strong staining intensity. Proliferation marker Ki67 at 15%. HER2 equivocal by immunohistochemistry (2+), but negative by fluorescent in situ hybridization with a signals ratio 1.3 and number per cell 1.75. ? ?The area in the right breast at 2:30 was biopsied at the same time showing fibrocystic change with apocrine metaplasia. ? ? Cancer Staging  ?Malignant neoplasm of upper-outer quadrant of right breast in female, estrogen receptor positive ([place]) ?Staging form: Breast, AJCC 8th Edition ?- Clinical stage from 01/22/2021: Stage IB (cT2, cN0, cM0, G2, ER+, PR+, HER2-) - Signed by Chauncey Cruel, MD on 01/22/2021 ?Stage prefix: Initial diagnosis ?Histologic grading system: 3 grade system ? ? ?The patient's subsequent history is as detailed below. ? ? ?PAST MEDICAL HISTORY: ?Past Medical History:  ?Diagnosis Date  ? Anemia   ? Anxiety   ? Breast cancer ([place])   ? Cataract   ? Cervical radiculopathy   ? CKD (chronic kidney disease)   ? stage 3  ? Hearing loss   ? History of radiation therapy   ? Right breast- 03/27/21-04/25/21- Dr. Gery Pray  ? HLD (hyperlipidemia)   ? Obesity   ? Osteoarthritis   ?She reports a history of skin cancer, treated with 19 fractions of radiation therapy while living in [place] (Dr. Juanito Doom) ? ? ?PAST SURGICAL HISTORY: ?Past Surgical History:  ?Procedure Laterality Date  ? BREAST BIOPSY Left   ?  BREAST LUMPECTOMY WITH RADIOACTIVE SEED AND SENTINEL LYMPH NODE  BIOPSY Right 02/17/2021  ? Procedure: RIGHT BREAST LUMPECTOMY WITH RADIOACTIVE SEED AND SENTINEL LYMPH NODE BIOPSY;  Surgeon: Coralie Keens, MD;  Location: [facility];  Service: General;  Laterality: Right;  ? COLONOSCOPY  2016  ? FOOT SURGERY Right   ? MENISCUS REPAIR Right 06/08/2006  ? ? ?FAMILY HISTORY: ?Family History  ?Problem Relation Age of Onset  ? Diabetes Mother   ? Dementia Mother   ? Heart disease Father   ?     age 8 death  ? Alcohol abuse Father   ? Esophageal cancer Brother   ? Colon cancer Neg Hx   ? Stomach cancer Neg Hx   ? Her father died at age 28 from MI. Her mother died at age 51. Makaley has three brothers (and no sisters). There is no family history of cancer to her knowledge. ? ? ?GYNECOLOGIC HISTORY:  ?No LMP recorded. Patient is postmenopausal. ?Menarche: 76 years old ?Age at first live birth: 76 years old ?GX P 2 ?LMP date unsure ?Contraceptive: used from age "4 to ?"  (Several years) ?HRT never used  ?Hysterectomy? no ?BSO? no ? ? 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 Husband Herbie Baltimore "Mikki Lee" is a Clinical biochemist. She lives at home with Mikki Lee; they have no pets. Son Chelsea Lee, age 69, is self employed and lives in [place], [place]. Daughter Chelsea Lee," age 46, is a Doctor, hospital for [employer] in [place], [place]. Chelsea Lee attends [employer]. ?  ? ADVANCED DIRECTIVES: In the absence of any documentation to the contrary, the patient's spouse is their HCPOA.  ? ? ?HEALTH MAINTENANCE: ?Social History  ? ?Tobacco Use  ? Smoking status: Never  ? Smokeless tobacco: Never  ?Substance Use Topics  ? Alcohol use: Yes  ?  Alcohol/week: 2.0 standard drinks  ?  Types: 2 Glasses of wine per week  ?  Comment: social  ? Drug use: No  ? ? ? Colonoscopy: yes, done in [place] ? PAP: date unsure ? Bone density: Nov 2022 ? ?No Known Allergies ? ?Current Outpatient Medications  ?Medication Sig Dispense Refill  ?  ALPRAZolam (XANAX) 1 MG tablet Take 1 tablet (1 mg total) by mouth 3 (three) times daily as needed for anxiety. 30 tablet 0  ? anastrozole (ARIMIDEX) 1 MG tablet Take 1 tablet (1 mg total) by mouth daily. 90 tablet 4  ? Apoaequorin (PREVAGEN PO) Take 1 tablet by mouth daily.    ? celecoxib (CELEBREX) 200 MG capsule Take 200 mg by mouth 2 (two) times daily.    ? cholecalciferol (VITAMIN D3) 25 MCG (1000 UNIT) tablet Take 1 tablet (1,000 Units total) by mouth daily.    ? diphenoxylate-atropine (LOMOTIL) 2.5-0.025 MG tablet Take 2 tablets by mouth 4 (four) times daily as needed for diarrhea or loose stools. Not to exceed 8 tablets per day 60 tablet 0  ? gabapentin (NEURONTIN) 300 MG capsule Take 1 capsule (300 mg total) by mouth 2 (two) times daily. 270 capsule 3  ? simvastatin (ZOCOR) 40 MG tablet Take 1 tablet (40 mg total) by mouth at bedtime. 30 tablet   ? ?No current facility-administered medications for this visit.  ? ? ?OBJECTIVE: White woman who appears ? ?Vitals:  ? 09/08/21 1336  ?BP: (!) 152/79  ?Pulse: 72  ?Resp: 16  ?Temp: 98.1 ?F (36.7 ?C)  ?SpO2: 95%  ? ? ?  Body mass index is 31.41 kg/m?.   ?Wt Readings from Last 3 Encounters:  ?09/08/21 206 lb 9.6 oz (93.7 kg)  ?07/29/21 196 lb (88.9 kg)  ?07/03/21 196 lb (88.9 kg)  ? ? ?  ECOG FS:1 - Symptomatic but completely ambulatory ? ?Physical Exam ?Constitutional:   ?   Appearance: Normal appearance.  ?Cardiovascular:  ?   Rate and Rhythm: Normal rate and regular rhythm.  ?   Pulses: Normal pulses.  ?   Heart sounds: Normal heart sounds.  ?Pulmonary:  ?   Effort: Pulmonary effort is normal.  ?   Breath sounds: Normal breath sounds.  ?Chest:  ?   Comments: Bilateral breasts inspected.  Postradiation changes in the right breast with postop seroma.  No other palpable masses.  No regional adenopathy.  Left breast normal to inspection and palpation. ?Abdominal:  ?   General: Abdomen is flat. Bowel sounds are normal.  ?   Palpations: Abdomen is soft.   ?Musculoskeletal:     ?   General: No swelling or tenderness.  ?   Cervical back: Normal range of motion and neck supple. No rigidity.  ?Lymphadenopathy:  ?   Cervical: No cervical adenopathy.  ?Skin: ?   General: Skin is warm and

## 2021-09-08 NOTE — Progress Notes (Signed)
?   SCP reviewed and completed. Diet and exercise benefits discussed. Pt will restart water aerobics this week. Pt's bouts with diarrhea is resolving. She does use 1 Imodium ?every morning.  Pt states she does have some fatigue. She rates it 4/10., but tries to push through. Pt denies pain today.  ?

## 2021-09-10 DIAGNOSIS — M1711 Unilateral primary osteoarthritis, right knee: Secondary | ICD-10-CM | POA: Diagnosis not present

## 2021-09-22 ENCOUNTER — Other Ambulatory Visit: Payer: Self-pay | Admitting: Student

## 2021-09-22 DIAGNOSIS — M79671 Pain in right foot: Secondary | ICD-10-CM

## 2021-09-22 DIAGNOSIS — T84213A Breakdown (mechanical) of internal fixation device of bones of foot and toes, initial encounter: Secondary | ICD-10-CM | POA: Diagnosis not present

## 2021-09-24 ENCOUNTER — Encounter (HOSPITAL_COMMUNITY): Payer: Self-pay

## 2021-09-25 ENCOUNTER — Ambulatory Visit
Admission: RE | Admit: 2021-09-25 | Discharge: 2021-09-25 | Disposition: A | Discharge status: Home / Self Care | Payer: Medicare Other | Source: Ambulatory Visit | Attending: Student | Admitting: Student

## 2021-09-25 DIAGNOSIS — M79671 Pain in right foot: Secondary | ICD-10-CM

## 2021-09-25 DIAGNOSIS — M7989 Other specified soft tissue disorders: Secondary | ICD-10-CM | POA: Diagnosis not present

## 2021-10-05 DIAGNOSIS — E785 Hyperlipidemia, unspecified: Secondary | ICD-10-CM | POA: Diagnosis not present

## 2021-10-05 DIAGNOSIS — F419 Anxiety disorder, unspecified: Secondary | ICD-10-CM | POA: Diagnosis not present

## 2021-10-05 DIAGNOSIS — I10 Essential (primary) hypertension: Secondary | ICD-10-CM | POA: Diagnosis not present

## 2021-10-06 ENCOUNTER — Ambulatory Visit: Payer: Medicare Other | Admitting: Gastroenterology

## 2021-10-06 NOTE — Progress Notes (Deleted)
Review of pertinent gastrointestinal problems: 1.  History of precancerous colon polyps.  Colonoscopy 2016 Dr. Ardis Hughs found a single subcentimeter tubular adenoma.  Repeat colonoscopy 2023 no polyps  2.  Chronic diarrhea 2023 GI pathogen panel negative, fecal lactoferrin positive.  Eventual   colonoscopy 07/2021 normal terminal ileum, diverticulosis was noted, no polyps or cancers.  Random colon biopsies were all normal.

## 2021-10-08 DIAGNOSIS — M1711 Unilateral primary osteoarthritis, right knee: Secondary | ICD-10-CM | POA: Diagnosis not present

## 2021-10-13 DIAGNOSIS — T8484XA Pain due to internal orthopedic prosthetic devices, implants and grafts, initial encounter: Secondary | ICD-10-CM | POA: Diagnosis not present

## 2021-10-13 DIAGNOSIS — M2041 Other hammer toe(s) (acquired), right foot: Secondary | ICD-10-CM | POA: Diagnosis not present

## 2021-10-13 DIAGNOSIS — M7741 Metatarsalgia, right foot: Secondary | ICD-10-CM | POA: Diagnosis not present

## 2021-10-15 DIAGNOSIS — M1711 Unilateral primary osteoarthritis, right knee: Secondary | ICD-10-CM | POA: Diagnosis not present

## 2021-10-24 DIAGNOSIS — M2041 Other hammer toe(s) (acquired), right foot: Secondary | ICD-10-CM | POA: Diagnosis not present

## 2021-10-24 DIAGNOSIS — M79671 Pain in right foot: Secondary | ICD-10-CM | POA: Diagnosis not present

## 2021-11-13 ENCOUNTER — Ambulatory Visit
Admission: RE | Admit: 2021-11-13 | Discharge: 2021-11-13 | Disposition: A | Discharge status: Home / Self Care | Payer: Medicare Other | Source: Ambulatory Visit | Attending: Hematology and Oncology | Admitting: Hematology and Oncology

## 2021-11-13 DIAGNOSIS — Z853 Personal history of malignant neoplasm of breast: Secondary | ICD-10-CM | POA: Diagnosis not present

## 2021-11-13 DIAGNOSIS — Z9889 Other specified postprocedural states: Secondary | ICD-10-CM

## 2021-11-13 HISTORY — DX: Personal history of irradiation: Z92.3

## 2021-11-17 DIAGNOSIS — M25561 Pain in right knee: Secondary | ICD-10-CM | POA: Diagnosis not present

## 2021-11-17 DIAGNOSIS — M1711 Unilateral primary osteoarthritis, right knee: Secondary | ICD-10-CM | POA: Diagnosis not present

## 2021-12-10 ENCOUNTER — Ambulatory Visit: Payer: Medicare Other | Admitting: Gastroenterology

## 2021-12-10 ENCOUNTER — Telehealth: Payer: Self-pay | Admitting: Gastroenterology

## 2021-12-10 NOTE — Telephone Encounter (Signed)
Good Morning Dr. Ardis Hughs,   Patient called stating that she called on Monday and left a message that she wanted to cancel her appointment for today at 1:50 due to not needed appointment.  Patients appointment was canceled for today at 1:50.

## 2022-01-26 DIAGNOSIS — E785 Hyperlipidemia, unspecified: Secondary | ICD-10-CM | POA: Diagnosis not present

## 2022-01-26 DIAGNOSIS — R7989 Other specified abnormal findings of blood chemistry: Secondary | ICD-10-CM | POA: Diagnosis not present

## 2022-01-26 DIAGNOSIS — I10 Essential (primary) hypertension: Secondary | ICD-10-CM | POA: Diagnosis not present

## 2022-01-26 DIAGNOSIS — Z780 Asymptomatic menopausal state: Secondary | ICD-10-CM | POA: Diagnosis not present

## 2022-01-26 DIAGNOSIS — F419 Anxiety disorder, unspecified: Secondary | ICD-10-CM | POA: Diagnosis not present

## 2022-02-02 DIAGNOSIS — Z1331 Encounter for screening for depression: Secondary | ICD-10-CM | POA: Diagnosis not present

## 2022-02-02 DIAGNOSIS — Z0000 Encounter for general adult medical examination without abnormal findings: Secondary | ICD-10-CM | POA: Diagnosis not present

## 2022-02-02 DIAGNOSIS — I10 Essential (primary) hypertension: Secondary | ICD-10-CM | POA: Diagnosis not present

## 2022-02-02 DIAGNOSIS — R7303 Prediabetes: Secondary | ICD-10-CM | POA: Diagnosis not present

## 2022-02-02 DIAGNOSIS — E669 Obesity, unspecified: Secondary | ICD-10-CM | POA: Diagnosis not present

## 2022-02-02 DIAGNOSIS — G629 Polyneuropathy, unspecified: Secondary | ICD-10-CM | POA: Diagnosis not present

## 2022-02-02 DIAGNOSIS — C50411 Malignant neoplasm of upper-outer quadrant of right female breast: Secondary | ICD-10-CM | POA: Diagnosis not present

## 2022-02-02 DIAGNOSIS — F419 Anxiety disorder, unspecified: Secondary | ICD-10-CM | POA: Diagnosis not present

## 2022-02-02 DIAGNOSIS — E785 Hyperlipidemia, unspecified: Secondary | ICD-10-CM | POA: Diagnosis not present

## 2022-02-02 DIAGNOSIS — R7309 Other abnormal glucose: Secondary | ICD-10-CM | POA: Diagnosis not present

## 2022-02-02 DIAGNOSIS — Z1339 Encounter for screening examination for other mental health and behavioral disorders: Secondary | ICD-10-CM | POA: Diagnosis not present

## 2022-02-02 DIAGNOSIS — Z170 Estrogen receptor positive status [ER+]: Secondary | ICD-10-CM | POA: Diagnosis not present

## 2022-03-09 ENCOUNTER — Ambulatory Visit: Payer: Medicare Other | Admitting: Hematology and Oncology

## 2022-03-14 DIAGNOSIS — Z23 Encounter for immunization: Secondary | ICD-10-CM | POA: Diagnosis not present

## 2022-03-27 ENCOUNTER — Inpatient Hospital Stay: Payer: Medicare Other | Attending: Hematology and Oncology | Admitting: Hematology and Oncology

## 2022-03-27 VITALS — BP 147/65 | HR 94 | Temp 97.9°F | Resp 16 | Ht 68.0 in | Wt 215.6 lb

## 2022-03-27 DIAGNOSIS — Z853 Personal history of malignant neoplasm of breast: Secondary | ICD-10-CM | POA: Insufficient documentation

## 2022-03-27 DIAGNOSIS — Z85828 Personal history of other malignant neoplasm of skin: Secondary | ICD-10-CM | POA: Insufficient documentation

## 2022-03-27 DIAGNOSIS — C50411 Malignant neoplasm of upper-outer quadrant of right female breast: Secondary | ICD-10-CM

## 2022-03-27 DIAGNOSIS — Z170 Estrogen receptor positive status [ER+]: Secondary | ICD-10-CM | POA: Diagnosis not present

## 2022-03-27 DIAGNOSIS — Z79811 Long term (current) use of aromatase inhibitors: Secondary | ICD-10-CM | POA: Diagnosis not present

## 2022-03-27 DIAGNOSIS — Z923 Personal history of irradiation: Secondary | ICD-10-CM | POA: Diagnosis not present

## 2022-03-27 DIAGNOSIS — N189 Chronic kidney disease, unspecified: Secondary | ICD-10-CM | POA: Insufficient documentation

## 2022-03-27 NOTE — Progress Notes (Signed)
[facility]  Telephone:(336) [phone] Fax:(336) [phone]    ID: Chelsea Lee DOB: 07-Feb-1946  MR#: [id number]  PPI#:[id number]  Patient Care Team: Velna Hatchet, MD as PCP - General (Internal Medicine) Crist Infante, MD as Consulting Physician (Internal Medicine) Coralie Keens, MD as Consulting Physician (General Surgery) Gery Pray, MD as Consulting Physician (Radiation Oncology) Harriett Sine, MD as Consulting Physician (Dermatology) Melrose Nakayama, MD as Consulting Physician (Orthopedic Surgery) Tyler Pita, MD as Referring Physician (Otolaryngology) Benay Pike, MD as Consulting Physician (Hematology and Oncology) Delice Bison, Charlestine Massed, NP as Nurse Practitioner (Hematology and Oncology) Harmon Pier, RN as Registered Nurse Benay Pike, MD OTHER MD:  CHIEF COMPLAINT: Estrogen receptor positive breast cancer  CURRENT TREATMENT: Completing adjuvant radiation; to start anastrozole  INTERVAL HISTORY:  Chelsea Lee returns today for follow-up of her breast cancer unaccompanied. She has been taking anastrazole every day.   Diarrhea Rest of the pertinent 10 point ROS reviewed and negative.   REVIEW OF SYSTEMS:  A detailed review of systems today was otherwise stable.   COVID 19 VACCINATION STATUS: Pfizer x5 as of November 2022   HISTORY OF CURRENT ILLNESS: From the original intake note:  Chelsea Lee had routine screening mammography on 11/11/2020 showing a possible abnormality in the bilateral breasts. She underwent bilateral diagnostic mammography with tomography and right breast ultrasonography at [facility] on 01/08/2021 showing: breast density category C; 2.6 cm posterior upper-outer right breast mass; no abnormal right axillary lymph nodes; more likely benign complex cystic changes within inner right breast; benign left breast calcifications.  Accordingly on 01/13/2021 she proceeded to biopsy of the right breast area in  question. The pathology from the 11 o'clock mass ([id number]) showed: invasive ductal carcinoma, grade 2-3; ductal carcinoma in situ with necrosis. Prognostic indicators significant for: estrogen receptor, >95% positive and progesterone receptor, >95% positive, both with strong staining intensity. Proliferation marker Ki67 at 15%. HER2 equivocal by immunohistochemistry (2+), but negative by fluorescent in situ hybridization with a signals ratio 1.3 and number per cell 1.75.  The area in the right breast at 2:30 was biopsied at the same time showing fibrocystic change with apocrine metaplasia.   Cancer Staging  Malignant neoplasm of upper-outer quadrant of right breast in female, estrogen receptor positive ([place]) Staging form: Breast, AJCC 8th Edition - Clinical stage from 01/22/2021: Stage IB (cT2, cN0, cM0, G2, ER+, PR+, HER2-) - Signed by Chauncey Cruel, MD on 01/22/2021 Stage prefix: Initial diagnosis Histologic grading system: 3 grade system   The patient's subsequent history is as detailed below.   PAST MEDICAL HISTORY: Past Medical History:  Diagnosis Date  . Anemia   . Anxiety   . Breast cancer ([place])    02/17/21 right breast  . Cataract   . Cervical radiculopathy   . CKD (chronic kidney disease)    stage 3  . Hearing loss   . History of radiation therapy    Right breast- 03/27/21-04/25/21- Dr. Gery Pray  . HLD (hyperlipidemia)   . Obesity   . Osteoarthritis   . Personal history of radiation therapy   She reports a history of skin cancer, treated with 19 fractions of radiation therapy while living in [place] (Dr. Juanito Doom)   PAST SURGICAL HISTORY: Past Surgical History:  Procedure Laterality Date  . BREAST BIOPSY Left   . BREAST LUMPECTOMY Right    02/17/21  . BREAST LUMPECTOMY WITH RADIOACTIVE SEED AND SENTINEL LYMPH NODE BIOPSY Right 02/17/2021   Procedure: RIGHT BREAST LUMPECTOMY WITH  RADIOACTIVE SEED AND SENTINEL LYMPH NODE BIOPSY;  Surgeon: Coralie Keens,  MD;  Location: [facility];  Service: General;  Laterality: Right;  . COLONOSCOPY  2016  . FOOT SURGERY Right   . MENISCUS REPAIR Right 06/08/2006    FAMILY HISTORY: Family History  Problem Relation Age of Onset  . Diabetes Mother   . Dementia Mother   . Heart disease Father        age 72 death  . Alcohol abuse Father   . Esophageal cancer Brother   . Colon cancer Neg Hx   . Stomach cancer Neg Hx    Her father died at age 74 from MI. Her mother died at age 33. Chelsea Lee has three brothers (and no sisters). There is no family history of cancer to her knowledge.   GYNECOLOGIC HISTORY:  No LMP recorded. Patient is postmenopausal. Menarche: 76 years old Age at first live birth: 76 years old [place] P 2 LMP date unsure Contraceptive: used from age "68 to ?"  (Several years) HRT never used  Hysterectomy? no BSO? 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 Husband Chelsea Baltimore "[name]" is a Clinical biochemist. She lives at home with [name]; they have no pets. Son Chelsea Lee, age 31, is self employed and lives in [place], [place]. Daughter Chelsea Lee," age 68, is a Doctor, hospital for [employer] in [place], [place]. Keirra attends [employer].    ADVANCED DIRECTIVES: In the absence of any documentation to the contrary, the patient's spouse is their HCPOA.    HEALTH MAINTENANCE: Social History   Tobacco Use  . Smoking status: Never  . Smokeless tobacco: Never  Substance Use Topics  . Alcohol use: Yes    Alcohol/week: 2.0 standard drinks of alcohol    Types: 2 Glasses of wine per week    Comment: social  . Drug use: No     Colonoscopy: yes, done in [place]  PAP: date unsure  Bone density: Nov 2022  No Known Allergies  Current Outpatient Medications  Medication Sig Dispense Refill  . ALPRAZolam (XANAX) 1 MG tablet Take 1 tablet (1 mg total) by mouth 3 (three) times daily as needed for anxiety. (Patient not  taking: Reported on 09/08/2021) 30 tablet 0  . anastrozole (ARIMIDEX) 1 MG tablet Take 1 tablet (1 mg total) by mouth daily. 90 tablet 4  . Apoaequorin (PREVAGEN PO) Take 1 tablet by mouth daily.    . celecoxib (CELEBREX) 200 MG capsule Take 200 mg by mouth 2 (two) times daily.    . cholecalciferol (VITAMIN D3) 25 MCG (1000 UNIT) tablet Take 1 tablet (1,000 Units total) by mouth daily.    . diphenoxylate-atropine (LOMOTIL) 2.5-0.025 MG tablet Take 2 tablets by mouth 4 (four) times daily as needed for diarrhea or loose stools. Not to exceed 8 tablets per day (Patient not taking: Reported on 09/08/2021) 60 tablet 0  . gabapentin (NEURONTIN) 300 MG capsule Take 1 capsule (300 mg total) by mouth 2 (two) times daily. 270 capsule 3  . loperamide (IMODIUM) 2 MG capsule Take by mouth daily.    . simvastatin (ZOCOR) 40 MG tablet Take 1 tablet (40 mg total) by mouth at bedtime. 30 tablet    No current facility-administered medications for this visit.    OBJECTIVE: White woman who appears  There were no vitals filed for this visit.      There is no height or weight on file  to calculate BMI.   Wt Readings from Last 3 Encounters:  09/08/21 206 lb 9.6 oz (93.7 kg)  07/29/21 196 lb (88.9 kg)  07/03/21 196 lb (88.9 kg)      ECOG FS:1 - Symptomatic but completely ambulatory  Physical Exam Constitutional:      Appearance: Normal appearance.  Cardiovascular:     Rate and Rhythm: Normal rate and regular rhythm.     Pulses: Normal pulses.     Heart sounds: Normal heart sounds.  Pulmonary:     Effort: Pulmonary effort is normal.     Breath sounds: Normal breath sounds.  Chest:     Comments: Bilateral breasts inspected.  Postradiation changes in the right breast with postop seroma.  No other palpable masses.  No regional adenopathy.  Left breast normal to inspection and palpation. Abdominal:     General: Abdomen is flat. Bowel sounds are normal.     Palpations: Abdomen is soft.  Musculoskeletal:         General: No swelling or tenderness.     Cervical back: Normal range of motion and neck supple. No rigidity.  Lymphadenopathy:     Cervical: No cervical adenopathy.  Skin:    General: Skin is warm and dry.  Neurological:     General: No focal deficit present.     Mental Status: She is alert.     LAB RESULTS:  CMP     Component Value Date/Time   NA 140 06/10/2021 1530   K 4.0 06/10/2021 1530   CL 107 06/10/2021 1530   CO2 27 06/10/2021 1530   GLUCOSE 141 (H) 06/10/2021 1530   BUN 21 06/10/2021 1530   CREATININE 1.18 (H) 06/10/2021 1530   CREATININE 1.25 (H) 01/22/2021 0825   CALCIUM 9.4 06/10/2021 1530   PROT 6.7 06/10/2021 1530   ALBUMIN 3.9 06/10/2021 1530   AST 24 06/10/2021 1530   AST 25 01/22/2021 0825   ALT 23 06/10/2021 1530   ALT 29 01/22/2021 0825   ALKPHOS 92 06/10/2021 1530   BILITOT 0.4 06/10/2021 1530   BILITOT 0.5 01/22/2021 0825   GFRNONAA 48 (L) 06/10/2021 1530   GFRNONAA 44 (L) 01/22/2021 0825    No results found for: "TOTALPROTELP", "ALBUMINELP", "A1GS", "A2GS", "BETS", "BETA2SER", "GAMS", "MSPIKE", "SPEI"  Lab Results  Component Value Date   WBC 4.4 07/03/2021   NEUTROABS 2.3 06/10/2021   HGB 12.6 07/03/2021   HCT 38.3 07/03/2021   MCV 93.6 07/03/2021   PLT 174.0 07/03/2021    No results found for: "LABCA2"  No components found for: "YYQMGN003"  No results for input(s): "INR" in the last 168 hours.  No results found for: "LABCA2"  No results found for: "BCW888"  No results found for: "CAN125"  No results found for: "CAN153"  No results found for: "CA2729"  No components found for: "HGQUANT"  No results found for: "CEA1", "CEA" / No results found for: "CEA1", "CEA"   No results found for: "AFPTUMOR"  No results found for: "CHROMOGRNA"  No results found for: "KPAFRELGTCHN", "LAMBDASER", "KAPLAMBRATIO" (kappa/lambda light chains)  No results found for: "HGBA", "HGBA2QUANT", "HGBFQUANT", "HGBSQUAN" (Hemoglobinopathy  evaluation)   No results found for: "LDH"  No results found for: "IRON", "TIBC", "IRONPCTSAT" (Iron and TIBC)  No results found for: "FERRITIN"  Urinalysis No results found for: "COLORURINE", "APPEARANCEUR", "LABSPEC", "PHURINE", "GLUCOSEU", "HGBUR", "BILIRUBINUR", "KETONESUR", "PROTEINUR", "UROBILINOGEN", "NITRITE", "LEUKOCYTESUR"   STUDIES: No results found.   ELIGIBLE FOR AVAILABLE RESEARCH PROTOCOL: no  ASSESSMENT:   76 y.o.  [place] woman status post right breast upper outer quadrant biopsy 01/13/2021 for a clinical T2 N0, stage IB-IIA invasive ductal carcinoma, grade 2/3, estrogen and progesterone receptor positive, HER2 not amplified, with an MIB-1 of 15%   (A) biopsy of a separate area in the right breast upper outer quadrant 02/06/2021 showed no evidence of malignancy.  (1) right lumpectomy and sentinel lymph node sampling on 02/17/2021 showed a pT2 pN0, stage IB invasive ductal carcinoma, grade 2, with negative margins  (A) a total of 3 right axillary sentinel lymph nodes were removed.  (2) Oncotype score of 12 predicted a risk of recurrence outside the breast within the next 9 years of 3% if the patient's only systemic therapy is antiestrogens for 5 years.  It also predicted no significant benefit from chemotherapy.  (3) adjuvant radiation 03/27/2021 through 04/24/2021  (4) antiestrogens to follow the completion of local treatment  (A) bone density 05/19/2005-- repeat 04/24/2021  (B) used oral contraceptives several years with no complications  (5) anastrozole started 05/08/2021  Bone density is normal 04/24/2021, ok to continue Ca/Vit D supplements,   PLAN:  Continue arimidex for adjuvant antiestrogen therapy.  She is tolerating this well.  Has been expected last time, her diarrhea is likely unrelated since it has resolved while she is on anastrozole.  She continues to follow-up with GI, takes Phenergan once a day and Imodium once a day.  She is not quite sure  of the Phenergan dose. Physical examination today unremarkable.  Next mammogram due in June 2023, this has been scheduled. With regards to bone density, this is normal. Encouraged ca.vit D supplementation and weight bearing exercises. She can return to clinic in 6 months with this Reviewed colonoscopy from February 2023, diverticulosis, pathology is benign colonic mucosa with no diagnostic abnormality  I spent 30 minutes in the care of the patient including history, review of records, physical examination, counseling about diarrhea, role of antiestrogen therapy and follow-up recommendations  *Total Encounter Time as defined by the Centers for Medicare and Medicaid Services includes, in addition to the face-to-face time of a patient visit (documented in the note above) non-face-to-face time: obtaining and reviewing outside history, ordering and reviewing medications, tests or procedures, care coordination (communications with other health care professionals or caregivers) and documentation in the medical record.

## 2022-03-28 DIAGNOSIS — Z23 Encounter for immunization: Secondary | ICD-10-CM | POA: Diagnosis not present

## 2022-03-30 ENCOUNTER — Encounter: Payer: Self-pay | Admitting: Hematology and Oncology

## 2022-04-03 DIAGNOSIS — F419 Anxiety disorder, unspecified: Secondary | ICD-10-CM | POA: Diagnosis not present

## 2022-04-03 DIAGNOSIS — G4700 Insomnia, unspecified: Secondary | ICD-10-CM | POA: Diagnosis not present

## 2022-04-13 DIAGNOSIS — Z961 Presence of intraocular lens: Secondary | ICD-10-CM | POA: Diagnosis not present

## 2022-04-13 DIAGNOSIS — H524 Presbyopia: Secondary | ICD-10-CM | POA: Diagnosis not present

## 2022-04-13 DIAGNOSIS — H04123 Dry eye syndrome of bilateral lacrimal glands: Secondary | ICD-10-CM | POA: Diagnosis not present

## 2022-04-15 DIAGNOSIS — M1711 Unilateral primary osteoarthritis, right knee: Secondary | ICD-10-CM | POA: Diagnosis not present

## 2022-04-27 DIAGNOSIS — M1711 Unilateral primary osteoarthritis, right knee: Secondary | ICD-10-CM | POA: Diagnosis not present

## 2022-04-29 ENCOUNTER — Other Ambulatory Visit: Payer: Self-pay | Admitting: *Deleted

## 2022-04-29 MED ORDER — ANASTROZOLE 1 MG PO TABS: 1.0000 mg | ORAL_TABLET | Freq: Every day | ORAL | 4 refills | Status: DC

## 2022-05-04 DIAGNOSIS — G4700 Insomnia, unspecified: Secondary | ICD-10-CM | POA: Diagnosis not present

## 2022-05-04 DIAGNOSIS — M1711 Unilateral primary osteoarthritis, right knee: Secondary | ICD-10-CM | POA: Diagnosis not present

## 2022-05-04 DIAGNOSIS — F419 Anxiety disorder, unspecified: Secondary | ICD-10-CM | POA: Diagnosis not present

## 2022-07-06 DIAGNOSIS — M1711 Unilateral primary osteoarthritis, right knee: Secondary | ICD-10-CM | POA: Diagnosis not present

## 2022-08-26 DIAGNOSIS — L570 Actinic keratosis: Secondary | ICD-10-CM | POA: Diagnosis not present

## 2022-08-26 DIAGNOSIS — L814 Other melanin hyperpigmentation: Secondary | ICD-10-CM | POA: Diagnosis not present

## 2022-08-26 DIAGNOSIS — L821 Other seborrheic keratosis: Secondary | ICD-10-CM | POA: Diagnosis not present

## 2022-08-26 DIAGNOSIS — Z85828 Personal history of other malignant neoplasm of skin: Secondary | ICD-10-CM | POA: Diagnosis not present

## 2022-08-26 DIAGNOSIS — C44311 Basal cell carcinoma of skin of nose: Secondary | ICD-10-CM | POA: Diagnosis not present

## 2022-08-26 DIAGNOSIS — L718 Other rosacea: Secondary | ICD-10-CM | POA: Diagnosis not present

## 2022-09-07 DIAGNOSIS — R051 Acute cough: Secondary | ICD-10-CM | POA: Diagnosis not present

## 2022-09-07 DIAGNOSIS — Z1152 Encounter for screening for COVID-19: Secondary | ICD-10-CM | POA: Diagnosis not present

## 2022-09-07 DIAGNOSIS — R599 Enlarged lymph nodes, unspecified: Secondary | ICD-10-CM | POA: Diagnosis not present

## 2022-09-07 DIAGNOSIS — R0981 Nasal congestion: Secondary | ICD-10-CM | POA: Diagnosis not present

## 2022-09-07 DIAGNOSIS — J029 Acute pharyngitis, unspecified: Secondary | ICD-10-CM | POA: Diagnosis not present

## 2022-09-07 DIAGNOSIS — J020 Streptococcal pharyngitis: Secondary | ICD-10-CM | POA: Diagnosis not present

## 2022-09-07 DIAGNOSIS — R6883 Chills (without fever): Secondary | ICD-10-CM | POA: Diagnosis not present

## 2022-09-07 DIAGNOSIS — R638 Other symptoms and signs concerning food and fluid intake: Secondary | ICD-10-CM | POA: Diagnosis not present

## 2022-09-16 DIAGNOSIS — J020 Streptococcal pharyngitis: Secondary | ICD-10-CM | POA: Diagnosis not present

## 2022-09-16 DIAGNOSIS — R051 Acute cough: Secondary | ICD-10-CM | POA: Diagnosis not present

## 2022-09-16 DIAGNOSIS — J40 Bronchitis, not specified as acute or chronic: Secondary | ICD-10-CM | POA: Diagnosis not present

## 2022-09-16 DIAGNOSIS — J069 Acute upper respiratory infection, unspecified: Secondary | ICD-10-CM | POA: Diagnosis not present

## 2022-09-16 DIAGNOSIS — J029 Acute pharyngitis, unspecified: Secondary | ICD-10-CM | POA: Diagnosis not present

## 2022-09-17 ENCOUNTER — Telehealth: Payer: Self-pay | Admitting: Hematology and Oncology

## 2022-09-17 NOTE — Telephone Encounter (Signed)
Spoke with patient confirming upcoming appointment change  

## 2022-09-28 ENCOUNTER — Inpatient Hospital Stay: Payer: Medicare Other | Admitting: Hematology and Oncology

## 2022-09-30 DIAGNOSIS — Z85828 Personal history of other malignant neoplasm of skin: Secondary | ICD-10-CM | POA: Diagnosis not present

## 2022-09-30 DIAGNOSIS — C44311 Basal cell carcinoma of skin of nose: Secondary | ICD-10-CM | POA: Diagnosis not present

## 2022-10-09 ENCOUNTER — Inpatient Hospital Stay: Payer: Medicare Other | Attending: Hematology and Oncology | Admitting: Hematology and Oncology

## 2022-10-09 ENCOUNTER — Encounter: Payer: Self-pay | Admitting: Hematology and Oncology

## 2022-10-09 ENCOUNTER — Other Ambulatory Visit: Payer: Self-pay

## 2022-10-09 VITALS — BP 134/61 | HR 85 | Temp 98.2°F | Resp 16 | Wt 213.7 lb

## 2022-10-09 DIAGNOSIS — Z800 Family history of malignant neoplasm of digestive organs: Secondary | ICD-10-CM | POA: Diagnosis not present

## 2022-10-09 DIAGNOSIS — C50411 Malignant neoplasm of upper-outer quadrant of right female breast: Secondary | ICD-10-CM | POA: Diagnosis not present

## 2022-10-09 DIAGNOSIS — Z79811 Long term (current) use of aromatase inhibitors: Secondary | ICD-10-CM | POA: Diagnosis not present

## 2022-10-09 DIAGNOSIS — Z170 Estrogen receptor positive status [ER+]: Secondary | ICD-10-CM

## 2022-10-09 NOTE — Progress Notes (Signed)
[facility]  Telephone:([phone]) [phone] Fax:([phone]) [phone]    ID: Chelsea Lee DOB: 03-21-1946  MR#: [id number]  ZHY#:[id number]  Patient Care Team: Alysia Penna, MD as PCP - General (Internal Medicine) Rodrigo Ran, MD as Consulting Physician (Internal Medicine) Abigail Miyamoto, MD as Consulting Physician (General Surgery) Antony Blackbird, MD as Consulting Physician (Radiation Oncology) Aris Lot, MD as Consulting Physician (Dermatology) Marcene Corning, MD as Consulting Physician (Orthopedic Surgery) Sarajane Marek, MD as Referring Physician (Otolaryngology) Rachel Moulds, MD as Consulting Physician (Hematology and Oncology) Axel Filler, Larna Daughters, NP as Nurse Practitioner (Hematology and Oncology) Maryclare Labrador, RN as Registered Nurse Rachel Moulds, MD  CHIEF COMPLAINT: Estrogen receptor positive breast cancer  CURRENT TREATMENT: Anastrozole  INTERVAL HISTORY:  Chelsea Lee returns today for follow-up of her breast cancer unaccompanied. She has been taking anastrazole every day.   She denies any complaints today except for some mild hot flashes.  She was sick with strep throat in the past week to 10 days, finally recovering.  She denies any breast changes except for some postop changes in the right breast with a large swelling.   Rest of the pertinent 10 point ROS reviewed and negative Rest of the pertinent 10 point ROS reviewed and negative.   REVIEW OF SYSTEMS:  A detailed review of systems today was otherwise stable.   COVID 19 VACCINATION STATUS: Pfizer x5 as of November 2022   HISTORY OF CURRENT ILLNESS: From the original intake note:  Chelsea Lee had routine screening mammography on 11/11/2020 showing a possible abnormality in the bilateral breasts. She underwent bilateral diagnostic mammography with tomography and right breast ultrasonography at [facility] on 01/08/2021 showing: breast density category C; 2.6 cm posterior  upper-outer right breast mass; no abnormal right axillary lymph nodes; more likely benign complex cystic changes within inner right breast; benign left breast calcifications.  Accordingly on 01/13/2021 she proceeded to biopsy of the right breast area in question. The pathology from the 11 o'clock mass ([id number]) showed: invasive ductal carcinoma, grade 2-3; ductal carcinoma in situ with necrosis. Prognostic indicators significant for: estrogen receptor, >95% positive and progesterone receptor, >95% positive, both with strong staining intensity. Proliferation marker Ki67 at 15%. HER2 equivocal by immunohistochemistry (2+), but negative by fluorescent in situ hybridization with a signals ratio 1.3 and number per cell 1.75.  The area in the right breast at 2:30 was biopsied at the same time showing fibrocystic change with apocrine metaplasia.   Cancer Staging  Malignant neoplasm of upper-outer quadrant of right breast in female, estrogen receptor positive (HCC) Staging form: Breast, AJCC 8th Edition - Clinical stage from 01/22/2021: Stage IB (cT2, cN0, cM0, G2, ER+, PR+, HER2-) - Signed by Lowella Dell, MD on 01/22/2021 Stage prefix: Initial diagnosis Histologic grading system: 3 grade system   The patient's subsequent history is as detailed below.   PAST MEDICAL HISTORY: Past Medical History:  Diagnosis Date   Anemia    Anxiety    Breast cancer (HCC)    02/17/21 right breast   Cataract    Cervical radiculopathy    CKD (chronic kidney disease)    stage 3   Hearing loss    History of radiation therapy    Right breast- 03/27/21-04/25/21- Dr. Antony Blackbird   HLD (hyperlipidemia)    Obesity    Osteoarthritis    Personal history of radiation therapy   She reports a history of skin cancer, treated with 19 fractions of radiation therapy while living in [place] (  Dr. Rosalyn Gess)   PAST SURGICAL HISTORY: Past Surgical History:  Procedure Laterality Date   BREAST BIOPSY Left    BREAST  LUMPECTOMY Right    02/17/21   BREAST LUMPECTOMY WITH RADIOACTIVE SEED AND SENTINEL LYMPH NODE BIOPSY Right 02/17/2021   Procedure: RIGHT BREAST LUMPECTOMY WITH RADIOACTIVE SEED AND SENTINEL LYMPH NODE BIOPSY;  Surgeon: Abigail Miyamoto, MD;  Location: [facility];  Service: General;  Laterality: Right;   COLONOSCOPY  2016   FOOT SURGERY Right    MENISCUS REPAIR Right 06/08/2006    FAMILY HISTORY: Family History  Problem Relation Age of Onset   Diabetes Mother    Dementia Mother    Heart disease Father        age 59 death   Alcohol abuse Father    Esophageal cancer Brother    Colon cancer Neg Hx    Stomach cancer Neg Hx    Her father died at age 14 from MI. Her mother died at age 95. Chelsea Lee has three brothers (and no sisters). There is no family history of cancer to her knowledge.   GYNECOLOGIC HISTORY:  No LMP recorded. Patient is postmenopausal. Menarche: 77 years old Age at first live birth: 77 years old GX P 2 LMP date unsure Contraceptive: used from age "19 to ?"  (Several years) HRT never used  Hysterectomy? no BSO? 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 Husband Chelsea Maduro "Nadine Counts" is a Product/process development scientist. She lives at home with Nadine Counts; they have no pets. Son Chelsea Lee, age 53, is self employed and lives in [place], [place]. Daughter Chelsea Lee," age 53, is a Advertising copywriter for [employer] in [place], [place]. Chelsea Lee attends [employer].    ADVANCED DIRECTIVES: In the absence of any documentation to the contrary, the patient's spouse is their HCPOA.    HEALTH MAINTENANCE: Social History   Tobacco Use   Smoking status: Never   Smokeless tobacco: Never  Substance Use Topics   Alcohol use: Yes    Alcohol/week: 2.0 standard drinks of alcohol    Types: 2 Glasses of wine per week    Comment: social   Drug use: No     Colonoscopy: yes, done in [place]  PAP: date unsure  Bone density:  Nov 2022  No Known Allergies  Current Outpatient Medications  Medication Sig Dispense Refill   ALPRAZolam (XANAX) 1 MG tablet Take 1 tablet (1 mg total) by mouth 3 (three) times daily as needed for anxiety. (Patient not taking: Reported on 09/08/2021) 30 tablet 0   anastrozole (ARIMIDEX) 1 MG tablet Take 1 tablet (1 mg total) by mouth daily. 90 tablet 4   Apoaequorin (PREVAGEN PO) Take 1 tablet by mouth daily.     celecoxib (CELEBREX) 200 MG capsule Take 200 mg by mouth 2 (two) times daily.     cholecalciferol (VITAMIN D3) 25 MCG (1000 UNIT) tablet Take 1 tablet (1,000 Units total) by mouth daily.     diphenoxylate-atropine (LOMOTIL) 2.5-0.025 MG tablet Take 2 tablets by mouth 4 (four) times daily as needed for diarrhea or loose stools. Not to exceed 8 tablets per day (Patient not taking: Reported on 09/08/2021) 60 tablet 0   gabapentin (NEURONTIN) 300 MG capsule Take 1 capsule (300 mg total) by mouth 2 (two) times daily. 270 capsule 3   loperamide (IMODIUM) 2 MG capsule Take by mouth daily.     simvastatin (ZOCOR) 40 MG tablet Take 1  tablet (40 mg total) by mouth at bedtime. 30 tablet    No current facility-administered medications for this visit.    OBJECTIVE: White woman who appears stated age  There were no vitals filed for this visit.      There is no height or weight on file to calculate BMI.   Wt Readings from Last 3 Encounters:  03/27/22 215 lb 9.6 oz (97.8 kg)  09/08/21 206 lb 9.6 oz (93.7 kg)  07/29/21 196 lb (88.9 kg)      ECOG FS:1 - Symptomatic but completely ambulatory  Physical Exam Constitutional:      Appearance: Normal appearance.  Cardiovascular:     Rate and Rhythm: Normal rate and regular rhythm.     Pulses: Normal pulses.     Heart sounds: Normal heart sounds.  Pulmonary:     Effort: Pulmonary effort is normal.     Breath sounds: Normal breath sounds.  Chest:     Comments: Bilateral breasts inspected.  Postop seroma in the right breast about the surgical  scar.  No concerning masses ore regional adenopathy Abdominal:     General: Abdomen is flat. Bowel sounds are normal.     Palpations: Abdomen is soft.  Musculoskeletal:        General: No swelling or tenderness.     Cervical back: Normal range of motion and neck supple. No rigidity.  Lymphadenopathy:     Cervical: No cervical adenopathy.  Skin:    General: Skin is warm and dry.  Neurological:     General: No focal deficit present.     Mental Status: She is alert.      LAB RESULTS:  CMP     Component Value Date/Time   NA 140 06/10/2021 1530   K 4.0 06/10/2021 1530   CL 107 06/10/2021 1530   CO2 27 06/10/2021 1530   GLUCOSE 141 (H) 06/10/2021 1530   BUN 21 06/10/2021 1530   CREATININE 1.18 (H) 06/10/2021 1530   CREATININE 1.25 (H) 01/22/2021 0825   CALCIUM 9.4 06/10/2021 1530   PROT 6.7 06/10/2021 1530   ALBUMIN 3.9 06/10/2021 1530   AST 24 06/10/2021 1530   AST 25 01/22/2021 0825   ALT 23 06/10/2021 1530   ALT 29 01/22/2021 0825   ALKPHOS 92 06/10/2021 1530   BILITOT 0.4 06/10/2021 1530   BILITOT 0.5 01/22/2021 0825   GFRNONAA 48 (L) 06/10/2021 1530   GFRNONAA 44 (L) 01/22/2021 0825    No results found for: "TOTALPROTELP", "ALBUMINELP", "A1GS", "A2GS", "BETS", "BETA2SER", "GAMS", "MSPIKE", "SPEI"  Lab Results  Component Value Date   WBC 4.4 07/03/2021   NEUTROABS 2.3 06/10/2021   HGB 12.6 07/03/2021   HCT 38.3 07/03/2021   MCV 93.6 07/03/2021   PLT 174.0 07/03/2021    No results found for: "LABCA2"  No components found for: "WUJWJX914"  No results for input(s): "INR" in the last 168 hours.  No results found for: "LABCA2"  No results found for: "NWG956"  No results found for: "CAN125"  No results found for: "CAN153"  No results found for: "CA2729"  No components found for: "HGQUANT"  No results found for: "CEA1", "CEA" / No results found for: "CEA1", "CEA"   No results found for: "AFPTUMOR"  No results found for: "CHROMOGRNA"  No results  found for: "KPAFRELGTCHN", "LAMBDASER", "KAPLAMBRATIO" (kappa/lambda light chains)  No results found for: "HGBA", "HGBA2QUANT", "HGBFQUANT", "HGBSQUAN" (Hemoglobinopathy evaluation)   No results found for: "LDH"  No results found for: "IRON", "TIBC", "IRONPCTSAT" (Iron  and TIBC)  No results found for: "FERRITIN"  Urinalysis No results found for: "COLORURINE", "APPEARANCEUR", "LABSPEC", "PHURINE", "GLUCOSEU", "HGBUR", "BILIRUBINUR", "KETONESUR", "PROTEINUR", "UROBILINOGEN", "NITRITE", "LEUKOCYTESUR"   STUDIES: No results found.   ELIGIBLE FOR AVAILABLE RESEARCH PROTOCOL: no  ASSESSMENT:   77 y.o. [place] status post right breast upper outer quadrant biopsy 01/13/2021 for a clinical T2 N0, stage IB-IIA invasive ductal carcinoma, grade 2/3, estrogen and progesterone receptor positive, HER2 not amplified, with an MIB-1 of 15%   (A) biopsy of a separate area in the right breast upper outer quadrant 02/06/2021 showed no evidence of malignancy.  (1) right lumpectomy and sentinel lymph node sampling on 02/17/2021 showed a pT2 pN0, stage IB invasive ductal carcinoma, grade 2, with negative margins  (A) a total of 3 right axillary sentinel lymph nodes were removed.  (2) Oncotype score of 12 predicted a risk of recurrence outside the breast within the next 9 years of 3% if the patient's only systemic therapy is antiestrogens for 5 years.  It also predicted no significant benefit from chemotherapy.  (3) adjuvant radiation 03/27/2021 through 04/24/2021  (4) antiestrogens to follow the completion of local treatment  (A) bone density 05/19/2005-- repeat 04/24/2021  (B) used oral contraceptives several years with no complications  (5) anastrozole started 05/08/2021  Bone density is normal 04/24/2021, ok to continue Ca/Vit D supplements,   PLAN:  Chelsea Lee is here for follow-up.  She is doing quite well.  She is recovering from a strep throat, otherwise denies any major adverse  effects from antiestrogen therapy.  She has noticed some swelling in the right breast about the surgical scar but wonders if this is related to the surgery.  She is due for another mammogram in June 2024, last mammogram in June was unremarkable.  Bone density was normal.  Once again encouraged her to continue exercise, vitamin D supplementation.  She can return to it in 6 months or sooner as needed  I spent 30 minutes in the care of the patient including history, review of records, physical examination, counseling about diarrhea, role of antiestrogen therapy and follow-up recommendations  *Total Encounter Time as defined by the Centers for Medicare and Medicaid Services includes, in addition to the face-to-face time of a patient visit (documented in the note above) non-face-to-face time: obtaining and reviewing outside history, ordering and reviewing medications, tests or procedures, care coordination (communications with other health care professionals or caregivers) and documentation in the medical record.

## 2022-11-16 DIAGNOSIS — M1711 Unilateral primary osteoarthritis, right knee: Secondary | ICD-10-CM | POA: Diagnosis not present

## 2022-11-19 ENCOUNTER — Ambulatory Visit
Admission: RE | Admit: 2022-11-19 | Discharge: 2022-11-19 | Disposition: A | Discharge status: Home / Self Care | Payer: Medicare Other | Source: Ambulatory Visit | Attending: Hematology and Oncology | Admitting: Hematology and Oncology

## 2022-11-19 DIAGNOSIS — Z853 Personal history of malignant neoplasm of breast: Secondary | ICD-10-CM | POA: Diagnosis not present

## 2022-11-19 DIAGNOSIS — Z170 Estrogen receptor positive status [ER+]: Secondary | ICD-10-CM

## 2022-12-09 DIAGNOSIS — M5441 Lumbago with sciatica, right side: Secondary | ICD-10-CM | POA: Diagnosis not present

## 2022-12-09 DIAGNOSIS — M545 Low back pain, unspecified: Secondary | ICD-10-CM | POA: Diagnosis not present

## 2022-12-14 DIAGNOSIS — M545 Low back pain, unspecified: Secondary | ICD-10-CM | POA: Diagnosis not present

## 2022-12-15 DIAGNOSIS — M5416 Radiculopathy, lumbar region: Secondary | ICD-10-CM | POA: Diagnosis not present

## 2022-12-22 DIAGNOSIS — M5416 Radiculopathy, lumbar region: Secondary | ICD-10-CM | POA: Diagnosis not present

## 2022-12-23 DIAGNOSIS — M5416 Radiculopathy, lumbar region: Secondary | ICD-10-CM | POA: Diagnosis not present

## 2022-12-24 DIAGNOSIS — M5416 Radiculopathy, lumbar region: Secondary | ICD-10-CM | POA: Diagnosis not present

## 2022-12-28 DIAGNOSIS — M5416 Radiculopathy, lumbar region: Secondary | ICD-10-CM | POA: Diagnosis not present

## 2022-12-29 DIAGNOSIS — M5416 Radiculopathy, lumbar region: Secondary | ICD-10-CM | POA: Diagnosis not present

## 2022-12-31 DIAGNOSIS — M5416 Radiculopathy, lumbar region: Secondary | ICD-10-CM | POA: Diagnosis not present

## 2023-01-05 DIAGNOSIS — M5416 Radiculopathy, lumbar region: Secondary | ICD-10-CM | POA: Diagnosis not present

## 2023-01-12 DIAGNOSIS — M5416 Radiculopathy, lumbar region: Secondary | ICD-10-CM | POA: Diagnosis not present

## 2023-01-18 DIAGNOSIS — M2041 Other hammer toe(s) (acquired), right foot: Secondary | ICD-10-CM | POA: Diagnosis not present

## 2023-01-21 DIAGNOSIS — M5416 Radiculopathy, lumbar region: Secondary | ICD-10-CM | POA: Diagnosis not present

## 2023-01-21 DIAGNOSIS — M79661 Pain in right lower leg: Secondary | ICD-10-CM | POA: Diagnosis not present

## 2023-01-28 DIAGNOSIS — M948X7 Other specified disorders of cartilage, ankle and foot: Secondary | ICD-10-CM | POA: Diagnosis not present

## 2023-01-28 DIAGNOSIS — M2041 Other hammer toe(s) (acquired), right foot: Secondary | ICD-10-CM | POA: Diagnosis not present

## 2023-01-28 DIAGNOSIS — S93124A Dislocation of metatarsophalangeal joint of right lesser toe(s), initial encounter: Secondary | ICD-10-CM | POA: Diagnosis not present

## 2023-01-28 DIAGNOSIS — G8918 Other acute postprocedural pain: Secondary | ICD-10-CM | POA: Diagnosis not present

## 2023-01-28 DIAGNOSIS — M7741 Metatarsalgia, right foot: Secondary | ICD-10-CM | POA: Diagnosis not present

## 2023-02-11 DIAGNOSIS — I10 Essential (primary) hypertension: Secondary | ICD-10-CM | POA: Diagnosis not present

## 2023-02-11 DIAGNOSIS — R7989 Other specified abnormal findings of blood chemistry: Secondary | ICD-10-CM | POA: Diagnosis not present

## 2023-02-11 DIAGNOSIS — E785 Hyperlipidemia, unspecified: Secondary | ICD-10-CM | POA: Diagnosis not present

## 2023-02-12 DIAGNOSIS — M79671 Pain in right foot: Secondary | ICD-10-CM | POA: Diagnosis not present

## 2023-02-17 DIAGNOSIS — C50411 Malignant neoplasm of upper-outer quadrant of right female breast: Secondary | ICD-10-CM | POA: Diagnosis not present

## 2023-02-17 DIAGNOSIS — E669 Obesity, unspecified: Secondary | ICD-10-CM | POA: Diagnosis not present

## 2023-02-17 DIAGNOSIS — Z0000 Encounter for general adult medical examination without abnormal findings: Secondary | ICD-10-CM | POA: Diagnosis not present

## 2023-02-17 DIAGNOSIS — Z23 Encounter for immunization: Secondary | ICD-10-CM | POA: Diagnosis not present

## 2023-02-17 DIAGNOSIS — I10 Essential (primary) hypertension: Secondary | ICD-10-CM | POA: Diagnosis not present

## 2023-02-17 DIAGNOSIS — E1169 Type 2 diabetes mellitus with other specified complication: Secondary | ICD-10-CM | POA: Diagnosis not present

## 2023-02-17 DIAGNOSIS — M79671 Pain in right foot: Secondary | ICD-10-CM | POA: Diagnosis not present

## 2023-02-17 DIAGNOSIS — Z1331 Encounter for screening for depression: Secondary | ICD-10-CM | POA: Diagnosis not present

## 2023-02-17 DIAGNOSIS — G629 Polyneuropathy, unspecified: Secondary | ICD-10-CM | POA: Diagnosis not present

## 2023-02-17 DIAGNOSIS — R82998 Other abnormal findings in urine: Secondary | ICD-10-CM | POA: Diagnosis not present

## 2023-02-17 DIAGNOSIS — Z170 Estrogen receptor positive status [ER+]: Secondary | ICD-10-CM | POA: Diagnosis not present

## 2023-02-17 DIAGNOSIS — Z1339 Encounter for screening examination for other mental health and behavioral disorders: Secondary | ICD-10-CM | POA: Diagnosis not present

## 2023-02-17 DIAGNOSIS — E785 Hyperlipidemia, unspecified: Secondary | ICD-10-CM | POA: Diagnosis not present

## 2023-02-19 DIAGNOSIS — M79671 Pain in right foot: Secondary | ICD-10-CM | POA: Diagnosis not present

## 2023-02-25 DIAGNOSIS — S93144D Subluxation of metatarsophalangeal joint of right lesser toe(s), subsequent encounter: Secondary | ICD-10-CM | POA: Diagnosis not present

## 2023-02-25 DIAGNOSIS — S93144A Subluxation of metatarsophalangeal joint of right lesser toe(s), initial encounter: Secondary | ICD-10-CM | POA: Diagnosis not present

## 2023-03-10 DIAGNOSIS — M79671 Pain in right foot: Secondary | ICD-10-CM | POA: Diagnosis not present

## 2023-03-22 DIAGNOSIS — M79672 Pain in left foot: Secondary | ICD-10-CM | POA: Diagnosis not present

## 2023-03-30 DIAGNOSIS — Z23 Encounter for immunization: Secondary | ICD-10-CM | POA: Diagnosis not present

## 2023-04-05 DIAGNOSIS — M79672 Pain in left foot: Secondary | ICD-10-CM | POA: Diagnosis not present

## 2023-04-13 ENCOUNTER — Telehealth: Payer: Self-pay

## 2023-04-13 ENCOUNTER — Inpatient Hospital Stay: Payer: Medicare Other | Attending: Hematology and Oncology | Admitting: Hematology and Oncology

## 2023-04-13 VITALS — BP 122/50 | HR 97 | Temp 97.9°F | Resp 17 | Wt 210.5 lb

## 2023-04-13 DIAGNOSIS — Z79811 Long term (current) use of aromatase inhibitors: Secondary | ICD-10-CM | POA: Insufficient documentation

## 2023-04-13 DIAGNOSIS — C50411 Malignant neoplasm of upper-outer quadrant of right female breast: Secondary | ICD-10-CM

## 2023-04-13 DIAGNOSIS — Z1721 Progesterone receptor positive status: Secondary | ICD-10-CM | POA: Diagnosis not present

## 2023-04-13 DIAGNOSIS — Z780 Asymptomatic menopausal state: Secondary | ICD-10-CM

## 2023-04-13 DIAGNOSIS — Z800 Family history of malignant neoplasm of digestive organs: Secondary | ICD-10-CM | POA: Diagnosis not present

## 2023-04-13 DIAGNOSIS — Z923 Personal history of irradiation: Secondary | ICD-10-CM | POA: Diagnosis not present

## 2023-04-13 DIAGNOSIS — Z170 Estrogen receptor positive status [ER+]: Secondary | ICD-10-CM | POA: Diagnosis not present

## 2023-04-13 DIAGNOSIS — Z13820 Encounter for screening for osteoporosis: Secondary | ICD-10-CM

## 2023-04-13 DIAGNOSIS — N6081 Other benign mammary dysplasias of right breast: Secondary | ICD-10-CM | POA: Insufficient documentation

## 2023-04-13 NOTE — Telephone Encounter (Signed)
Per MD, most recent lab request faxed to [facility]. Fax confirmation received.

## 2023-04-13 NOTE — Progress Notes (Signed)
[facility]  [phone] [phone]    ID: Chelsea Lee DOB: Dec 03, 1945  MR#: [id number]  EXB#:[id number]  Patient Care Team: Alysia Penna, MD as PCP - General (Internal Medicine) Rodrigo Ran, MD as Consulting Physician (Internal Medicine) Abigail Miyamoto, MD as Consulting Physician (General Surgery) Antony Blackbird, MD as Consulting Physician (Radiation Oncology) Aris Lot, MD as Consulting Physician (Dermatology) Marcene Corning, MD as Consulting Physician (Orthopedic Surgery) Sarajane Marek, MD as Referring Physician (Otolaryngology) Rachel Moulds, MD as Consulting Physician (Hematology and Oncology) Axel Filler, Larna Daughters, NP as Nurse Practitioner (Hematology and Oncology) Maryclare Labrador, RN as Registered Nurse Rachel Moulds, MD  CHIEF COMPLAINT: Estrogen receptor positive breast cancer  CURRENT TREATMENT: Anastrozole  INTERVAL HISTORY: Discussed the use of AI scribe software for clinical note transcription with the patient, who gave verbal consent to proceed.  History of Present Illness          The patient, with a history of breast cancer, presents for a routine follow-up. She recently underwent foot surgery for a broken second toe. The initial surgery involved the placement of pins; however, one of the pins dislodged necessitating a second surgery. She is currently out of the post-operative boot and wearing firm sneakers until further healing.  The patient is on anastrozole for breast cancer management. She reports experiencing hot flashes, which she initially attributed to age. The hot flashes occur mostly at night but do not disrupt her sleep. She manages the hot flashes by sleeping with fewer blankets.  The patient also reports having a mammogram and bone density test. The mammogram results were reportedly normal, and the bone density test was done two years ago with good results.  The patient is also taking Xanax as  needed, usually once in the morning and once at night.  Rest of the pertinent 10 point ROS reviewed and negative.   REVIEW OF SYSTEMS:  A detailed review of systems today was otherwise stable.   COVID 19 VACCINATION STATUS: Pfizer x5 as of November 2022   HISTORY OF CURRENT ILLNESS: From the original intake note:  Chelsea Lee had routine screening mammography on 11/11/2020 showing a possible abnormality in the bilateral breasts. She underwent bilateral diagnostic mammography with tomography and right breast ultrasonography at [facility] on 01/08/2021 showing: breast density category C; 2.6 cm posterior upper-outer right breast mass; no abnormal right axillary lymph nodes; more likely benign complex cystic changes within inner right breast; benign left breast calcifications.  Accordingly on 01/13/2021 she proceeded to biopsy of the right breast area in question. The pathology from the 11 o'clock mass ([id number]) showed: invasive ductal carcinoma, grade 2-3; ductal carcinoma in situ with necrosis. Prognostic indicators significant for: estrogen receptor, >95% positive and progesterone receptor, >95% positive, both with strong staining intensity. Proliferation marker Ki67 at 15%. HER2 equivocal by immunohistochemistry (2+), but negative by fluorescent in situ hybridization with a signals ratio 1.3 and number per cell 1.75.  The area in the right breast at 2:30 was biopsied at the same time showing fibrocystic change with apocrine metaplasia.   Cancer Staging  Malignant neoplasm of upper-outer quadrant of right breast in female, estrogen receptor positive (HCC) Staging form: Breast, AJCC 8th Edition - Clinical stage from 01/22/2021: Stage IB (cT2, cN0, cM0, G2, ER+, PR+, HER2-) - Signed by Lowella Dell, MD on 01/22/2021 Stage prefix: Initial diagnosis Histologic grading system: 3 grade system   The patient's subsequent history is as detailed below.   PAST MEDICAL  HISTORY: Past  Medical History:  Diagnosis Date   Anemia    Anxiety    Breast cancer (HCC)    02/17/21 right breast   Cataract    Cervical radiculopathy    CKD (chronic kidney disease)    stage 3   Hearing loss    History of radiation therapy    Right breast- 03/27/21-04/25/21- Dr. Antony Blackbird   HLD (hyperlipidemia)    Obesity    Osteoarthritis    Personal history of radiation therapy   She reports a history of skin cancer, treated with 19 fractions of radiation therapy while living in [place] (Dr. Rosalyn Gess)   PAST SURGICAL HISTORY: Past Surgical History:  Procedure Laterality Date   BREAST BIOPSY Left    BREAST LUMPECTOMY Right    02/17/21   BREAST LUMPECTOMY WITH RADIOACTIVE SEED AND SENTINEL LYMPH NODE BIOPSY Right 02/17/2021   Procedure: RIGHT BREAST LUMPECTOMY WITH RADIOACTIVE SEED AND SENTINEL LYMPH NODE BIOPSY;  Surgeon: Abigail Miyamoto, MD;  Location: [facility];  Service: General;  Laterality: Right;   COLONOSCOPY  2016   FOOT SURGERY Right    MENISCUS REPAIR Right 06/08/2006    FAMILY HISTORY: Family History  Problem Relation Age of Onset   Diabetes Mother    Dementia Mother    Heart disease Father        age 49 death   Alcohol abuse Father    Esophageal cancer Brother    Colon cancer Neg Hx    Stomach cancer Neg Hx    Her father died at age 8 from MI. Her mother died at age 52. Chelsea Lee has three brothers (and no sisters). There is no family history of cancer to her knowledge.   GYNECOLOGIC HISTORY:  No LMP recorded. Patient is postmenopausal. Menarche: 77 years old Age at first live birth: 77 years old GX P 2 LMP date unsure Contraceptive: used from age "48 to ?"  (Several years) HRT never used  Hysterectomy? no BSO? 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 Husband Molly Maduro "Nadine Counts" is a Product/process development scientist. She lives at home with Nadine Counts; they have no pets. Son Willey, age 79, is self employed  and lives in [place], [place]. Daughter Leonard Schwartz," age 41, is a Advertising copywriter for [employer] in [place], [place]. Ayvah attends [employer].    ADVANCED DIRECTIVES: In the absence of any documentation to the contrary, the patient's spouse is their HCPOA.    HEALTH MAINTENANCE: Social History   Tobacco Use   Smoking status: Never   Smokeless tobacco: Never  Substance Use Topics   Alcohol use: Yes    Alcohol/week: 2.0 standard drinks of alcohol    Types: 2 Glasses of wine per week    Comment: social   Drug use: No     Colonoscopy: yes, done in [place]  PAP: date unsure  Bone density: Nov 2022  No Known Allergies  Current Outpatient Medications  Medication Sig Dispense Refill   ALPRAZolam (XANAX) 1 MG tablet Take 1 tablet (1 mg total) by mouth 3 (three) times daily as needed for anxiety. (Patient not taking: Reported on 09/08/2021) 30 tablet 0   anastrozole (ARIMIDEX) 1 MG tablet Take 1 tablet (1 mg total) by mouth daily. 90 tablet 4   Apoaequorin (PREVAGEN PO) Take 1 tablet by mouth daily.     celecoxib (CELEBREX) 200 MG capsule Take 200 mg by mouth 2 (two) times daily.  cholecalciferol (VITAMIN D3) 25 MCG (1000 UNIT) tablet Take 1 tablet (1,000 Units total) by mouth daily.     diphenoxylate-atropine (LOMOTIL) 2.5-0.025 MG tablet Take 2 tablets by mouth 4 (four) times daily as needed for diarrhea or loose stools. Not to exceed 8 tablets per day (Patient not taking: Reported on 09/08/2021) 60 tablet 0   gabapentin (NEURONTIN) 300 MG capsule Take 1 capsule (300 mg total) by mouth 2 (two) times daily. 270 capsule 3   loperamide (IMODIUM) 2 MG capsule Take by mouth daily.     simvastatin (ZOCOR) 40 MG tablet Take 1 tablet (40 mg total) by mouth at bedtime. 30 tablet    No current facility-administered medications for this visit.    OBJECTIVE: White woman who appears stated age  Vitals:   04/13/23 1346  BP: (!) 122/50  Pulse: 97  Resp: 17  Temp: 97.9 F (36.6  C)  SpO2: 93%       Body mass index is 32.01 kg/m.   Wt Readings from Last 3 Encounters:  04/13/23 210 lb 8 oz (95.5 kg)  10/09/22 213 lb 11.2 oz (96.9 kg)  03/27/22 215 lb 9.6 oz (97.8 kg)      ECOG FS:1 - Symptomatic but completely ambulatory  Physical Exam Constitutional:      Appearance: Normal appearance.  Cardiovascular:     Rate and Rhythm: Normal rate and regular rhythm.     Pulses: Normal pulses.     Heart sounds: Normal heart sounds.  Pulmonary:     Effort: Pulmonary effort is normal.     Breath sounds: Normal breath sounds.  Chest:     Comments: Bilateral breasts inspected.  Both breast with surgical scars.  Right breast with post surgical changes noted right about the surgical scar.  No other definitive palpable masses.  No regional adenopathy.  Left breast with a large biopsy scar from many many years ago.  No regional adenopathy. Abdominal:     General: Abdomen is flat. Bowel sounds are normal.     Palpations: Abdomen is soft.  Musculoskeletal:        General: No swelling or tenderness.     Cervical back: Normal range of motion and neck supple. No rigidity.  Lymphadenopathy:     Cervical: No cervical adenopathy.  Skin:    General: Skin is warm and dry.  Neurological:     General: No focal deficit present.     Mental Status: She is alert.     LAB RESULTS:  CMP     Component Value Date/Time   NA 140 06/10/2021 1530   K 4.0 06/10/2021 1530   CL 107 06/10/2021 1530   CO2 27 06/10/2021 1530   GLUCOSE 141 (H) 06/10/2021 1530   BUN 21 06/10/2021 1530   CREATININE 1.18 (H) 06/10/2021 1530   CREATININE 1.25 (H) 01/22/2021 0825   CALCIUM 9.4 06/10/2021 1530   PROT 6.7 06/10/2021 1530   ALBUMIN 3.9 06/10/2021 1530   AST 24 06/10/2021 1530   AST 25 01/22/2021 0825   ALT 23 06/10/2021 1530   ALT 29 01/22/2021 0825   ALKPHOS 92 06/10/2021 1530   BILITOT 0.4 06/10/2021 1530   BILITOT 0.5 01/22/2021 0825   GFRNONAA 48 (L) 06/10/2021 1530   GFRNONAA 44  (L) 01/22/2021 0825    No results found for: "TOTALPROTELP", "ALBUMINELP", "A1GS", "A2GS", "BETS", "BETA2SER", "GAMS", "MSPIKE", "SPEI"  Lab Results  Component Value Date   WBC 4.4 07/03/2021   NEUTROABS 2.3 06/10/2021   HGB  12.6 07/03/2021   HCT 38.3 07/03/2021   MCV 93.6 07/03/2021   PLT 174.0 07/03/2021    No results found for: "LABCA2"  No components found for: "ZOXWRU045"  No results for input(s): "INR" in the last 168 hours.  No results found for: "LABCA2"  No results found for: "WUJ811"  No results found for: "CAN125"  No results found for: "CAN153"  No results found for: "CA2729"  No components found for: "HGQUANT"  No results found for: "CEA1", "CEA" / No results found for: "CEA1", "CEA"   No results found for: "AFPTUMOR"  No results found for: "CHROMOGRNA"  No results found for: "KPAFRELGTCHN", "LAMBDASER", "KAPLAMBRATIO" (kappa/lambda light chains)  No results found for: "HGBA", "HGBA2QUANT", "HGBFQUANT", "HGBSQUAN" (Hemoglobinopathy evaluation)   No results found for: "LDH"  No results found for: "IRON", "TIBC", "IRONPCTSAT" (Iron and TIBC)  No results found for: "FERRITIN"  Urinalysis No results found for: "COLORURINE", "APPEARANCEUR", "LABSPEC", "PHURINE", "GLUCOSEU", "HGBUR", "BILIRUBINUR", "KETONESUR", "PROTEINUR", "UROBILINOGEN", "NITRITE", "LEUKOCYTESUR"   STUDIES: No results found.   ELIGIBLE FOR AVAILABLE RESEARCH PROTOCOL: no  ASSESSMENT:   77 y.o. [place] woman status post right breast upper outer quadrant biopsy 01/13/2021 for a clinical T2 N0, stage IB-IIA invasive ductal carcinoma, grade 2/3, estrogen and progesterone receptor positive, HER2 not amplified, with an MIB-1 of 15%   (A) biopsy of a separate area in the right breast upper outer quadrant 02/06/2021 showed no evidence of malignancy.  (1) right lumpectomy and sentinel lymph node sampling on 02/17/2021 showed a pT2 pN0, stage IB invasive ductal carcinoma, grade 2,  with negative margins  (A) a total of 3 right axillary sentinel lymph nodes were removed.  (2) Oncotype score of 12 predicted a risk of recurrence outside the breast within the next 9 years of 3% if the patient's only systemic therapy is antiestrogens for 5 years.  It also predicted no significant benefit from chemotherapy.  (3) adjuvant radiation 03/27/2021 through 04/24/2021  (4) antiestrogens to follow the completion of local treatment  (A) bone density 05/19/2005-- repeat 04/24/2021  (B) used oral contraceptives several years with no complications  (5) anastrozole started 05/08/2021  Bone density is normal 04/24/2021, ok to continue Ca/Vit D supplements,   PLAN:  Breast Cancer On Anastrozole with side effect of hot flashes, mostly at night. No significant distress or sleep disturbance. Discussed the role of hydration and exercise in managing hot flashes. -Continue Anastrozole. -Encourage hydration and exercise as tolerated.  Foot Surgery Recent surgery for broken toes with complications requiring repeat surgery. Currently in recovery phase with firm sneaker use until 05/03/2023. -Continue current post-operative care plan.  Bone Health Last bone density scan in November 2022 with good results. Due for repeat scan. -Schedule bone density scan after the holidays.  General Health Maintenance -Request recent labs from primary care physician, Dr. Link Snuffer. -Annual follow-up visit scheduled for next year.  I spent 30 minutes in the care of the patient including history, review of records, physical examination, counseling about diarrhea, role of antiestrogen therapy and follow-up recommendations  *Total Encounter Time as defined by the Centers for Medicare and Medicaid Services includes, in addition to the face-to-face time of a patient visit (documented in the note above) non-face-to-face time: obtaining and reviewing outside history, ordering and reviewing medications, tests or  procedures, care coordination (communications with other health care professionals or caregivers) and documentation in the medical record.

## 2023-04-19 DIAGNOSIS — M1711 Unilateral primary osteoarthritis, right knee: Secondary | ICD-10-CM | POA: Diagnosis not present

## 2023-04-22 DIAGNOSIS — H524 Presbyopia: Secondary | ICD-10-CM | POA: Diagnosis not present

## 2023-04-22 DIAGNOSIS — Z961 Presence of intraocular lens: Secondary | ICD-10-CM | POA: Diagnosis not present

## 2023-04-22 DIAGNOSIS — H04123 Dry eye syndrome of bilateral lacrimal glands: Secondary | ICD-10-CM | POA: Diagnosis not present

## 2023-05-12 DIAGNOSIS — N12 Tubulo-interstitial nephritis, not specified as acute or chronic: Secondary | ICD-10-CM | POA: Diagnosis not present

## 2023-05-12 DIAGNOSIS — R109 Unspecified abdominal pain: Secondary | ICD-10-CM | POA: Diagnosis not present

## 2023-05-12 DIAGNOSIS — K760 Fatty (change of) liver, not elsewhere classified: Secondary | ICD-10-CM | POA: Diagnosis not present

## 2023-05-17 DIAGNOSIS — M1711 Unilateral primary osteoarthritis, right knee: Secondary | ICD-10-CM | POA: Diagnosis not present

## 2023-07-14 DIAGNOSIS — G4700 Insomnia, unspecified: Secondary | ICD-10-CM | POA: Diagnosis not present

## 2023-07-14 DIAGNOSIS — K5904 Chronic idiopathic constipation: Secondary | ICD-10-CM | POA: Diagnosis not present

## 2023-07-14 DIAGNOSIS — E1121 Type 2 diabetes mellitus with diabetic nephropathy: Secondary | ICD-10-CM | POA: Diagnosis not present

## 2023-07-14 DIAGNOSIS — F419 Anxiety disorder, unspecified: Secondary | ICD-10-CM | POA: Diagnosis not present

## 2023-07-14 DIAGNOSIS — E669 Obesity, unspecified: Secondary | ICD-10-CM | POA: Diagnosis not present

## 2023-07-14 DIAGNOSIS — Z170 Estrogen receptor positive status [ER+]: Secondary | ICD-10-CM | POA: Diagnosis not present

## 2023-07-14 DIAGNOSIS — G629 Polyneuropathy, unspecified: Secondary | ICD-10-CM | POA: Diagnosis not present

## 2023-07-14 DIAGNOSIS — M1990 Unspecified osteoarthritis, unspecified site: Secondary | ICD-10-CM | POA: Diagnosis not present

## 2023-07-14 DIAGNOSIS — I10 Essential (primary) hypertension: Secondary | ICD-10-CM | POA: Diagnosis not present

## 2023-07-14 DIAGNOSIS — E785 Hyperlipidemia, unspecified: Secondary | ICD-10-CM | POA: Diagnosis not present

## 2023-07-14 DIAGNOSIS — C50411 Malignant neoplasm of upper-outer quadrant of right female breast: Secondary | ICD-10-CM | POA: Diagnosis not present

## 2023-07-22 DIAGNOSIS — J0100 Acute maxillary sinusitis, unspecified: Secondary | ICD-10-CM | POA: Diagnosis not present

## 2023-07-22 DIAGNOSIS — J40 Bronchitis, not specified as acute or chronic: Secondary | ICD-10-CM | POA: Diagnosis not present

## 2023-07-22 DIAGNOSIS — Z20822 Contact with and (suspected) exposure to covid-19: Secondary | ICD-10-CM | POA: Diagnosis not present

## 2023-07-22 DIAGNOSIS — R059 Cough, unspecified: Secondary | ICD-10-CM | POA: Diagnosis not present

## 2023-07-22 DIAGNOSIS — R0981 Nasal congestion: Secondary | ICD-10-CM | POA: Diagnosis not present

## 2023-07-22 DIAGNOSIS — J101 Influenza due to other identified influenza virus with other respiratory manifestations: Secondary | ICD-10-CM | POA: Diagnosis not present

## 2023-07-28 DIAGNOSIS — R059 Cough, unspecified: Secondary | ICD-10-CM | POA: Diagnosis not present

## 2023-07-28 DIAGNOSIS — R051 Acute cough: Secondary | ICD-10-CM | POA: Diagnosis not present

## 2023-07-28 DIAGNOSIS — R0602 Shortness of breath: Secondary | ICD-10-CM | POA: Diagnosis not present

## 2023-07-30 DIAGNOSIS — M1711 Unilateral primary osteoarthritis, right knee: Secondary | ICD-10-CM | POA: Diagnosis not present

## 2023-08-06 DIAGNOSIS — M1711 Unilateral primary osteoarthritis, right knee: Secondary | ICD-10-CM | POA: Diagnosis not present

## 2023-08-17 ENCOUNTER — Other Ambulatory Visit: Payer: Self-pay | Admitting: *Deleted

## 2023-08-17 MED ORDER — ANASTROZOLE 1 MG PO TABS: 1.0000 mg | ORAL_TABLET | Freq: Every day | ORAL | 4 refills | Status: AC

## 2023-08-23 IMAGING — MG MM PLC BREAST LOC DEV 1ST LESION INC*R*
8 series · 8 of 8 positions shown · non-contrast
Comparison: Previous exam(s).

CLINICAL DATA: Patient presents for seed localization prior to
RIGHT lumpectomy. Ultrasound-guided core biopsy of mass in the 11
o'clock location of the RIGHT breast showed grade 2-3 invasive
ductal carcinoma. Additional ultrasound and MR guided biopsies of
the RIGHT breast were benign and concordant.

EXAM:
MAMMOGRAPHIC GUIDED RADIOACTIVE SEED LOCALIZATION OF THE RIGHT
BREAST

[R CC (1 of 4)]
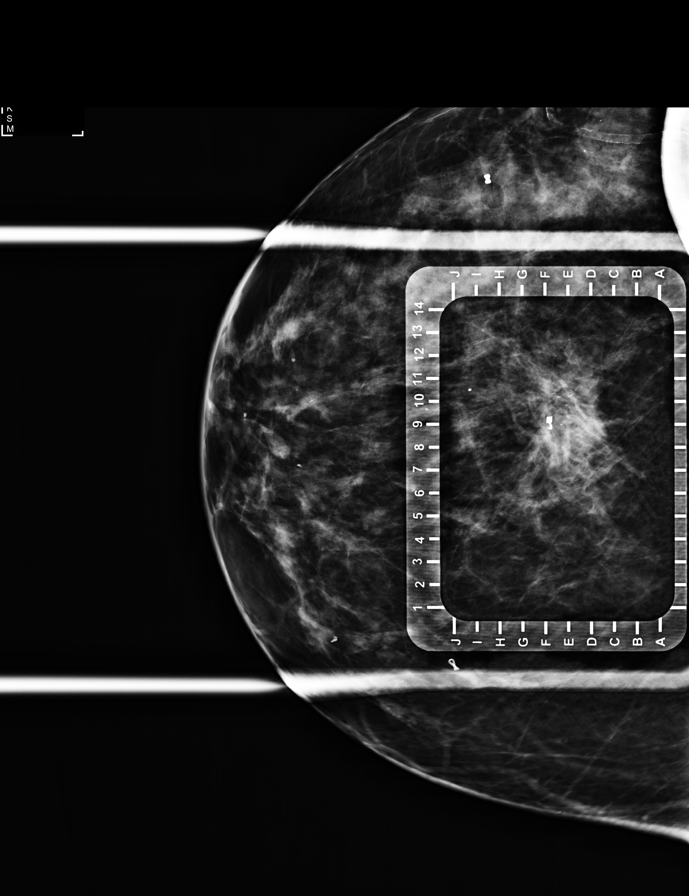

[R ML (1 of 4)]
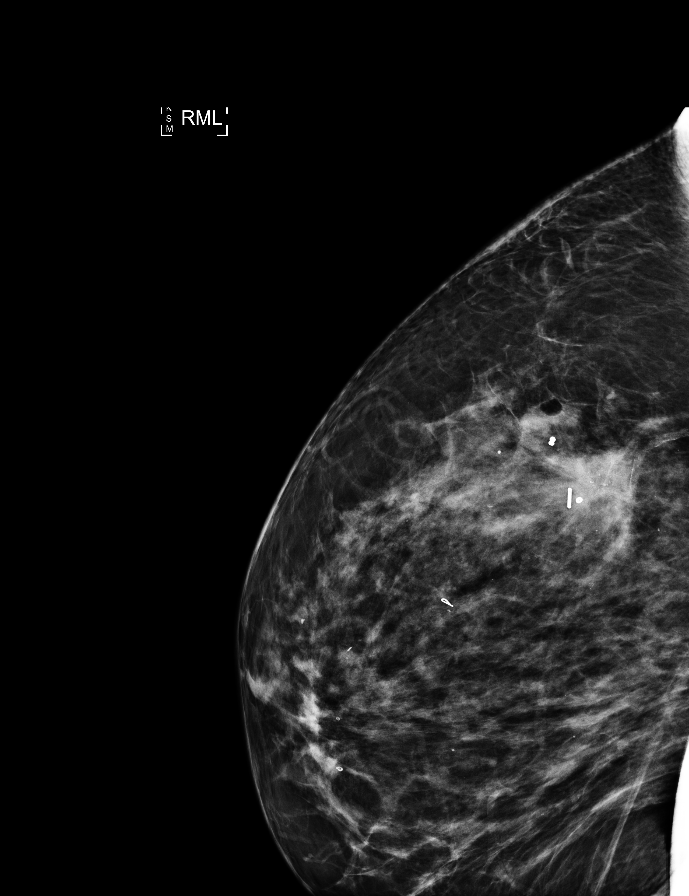

[R ML (2 of 4)]
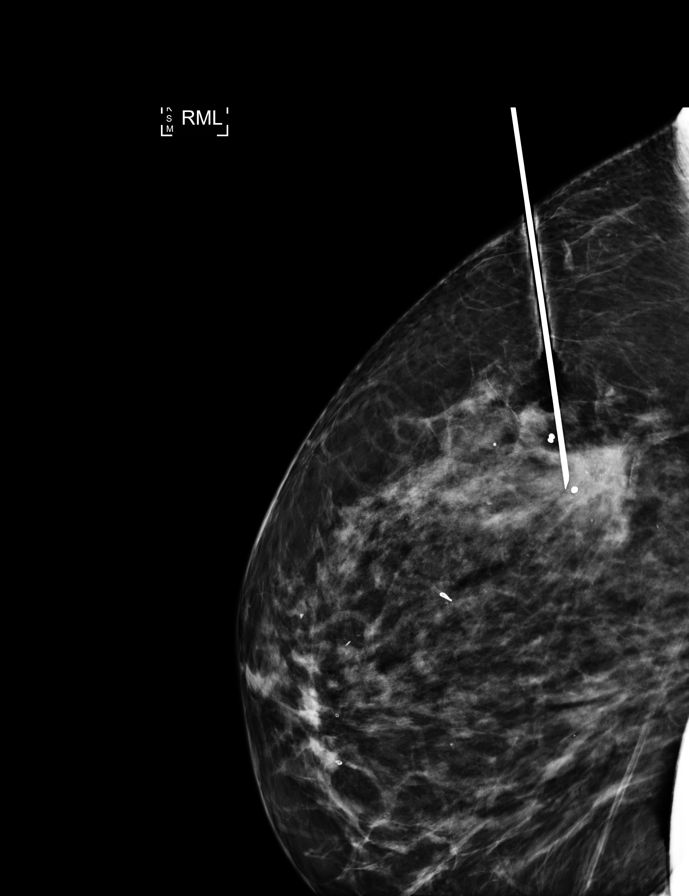

[R ML (3 of 4)]
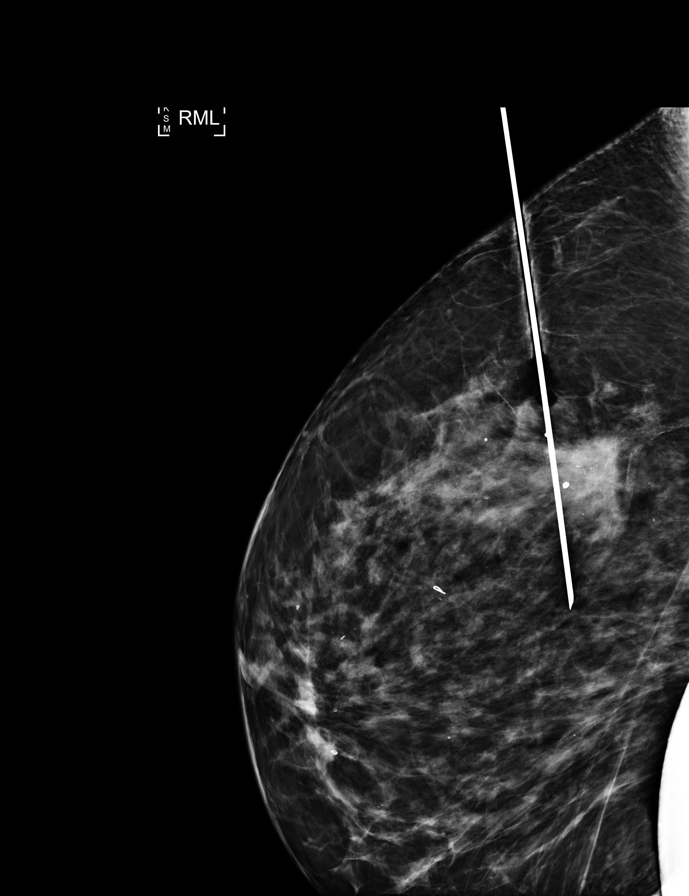

[R CC (2 of 4)]
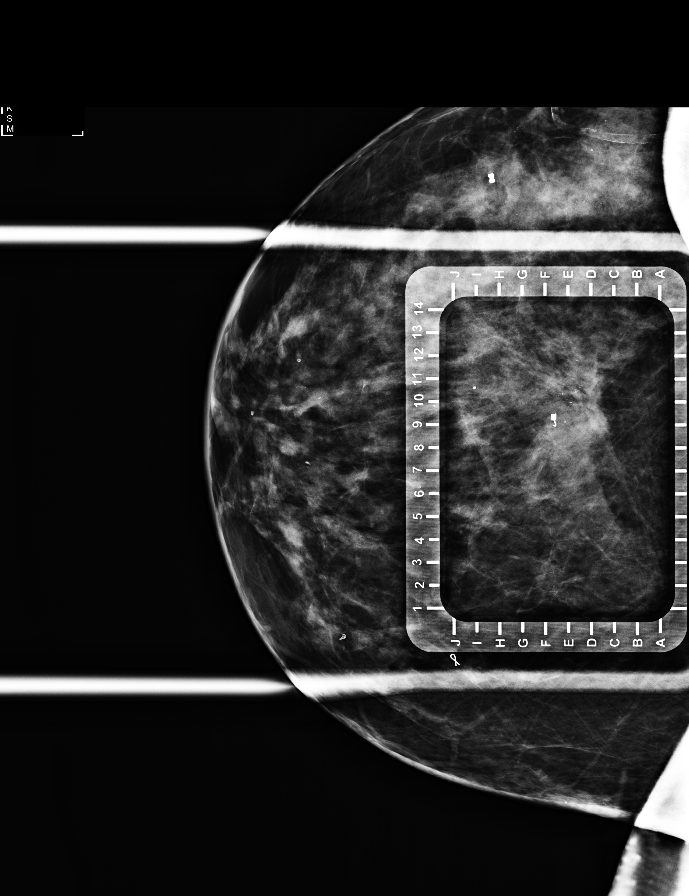

[R CC (3 of 4)]
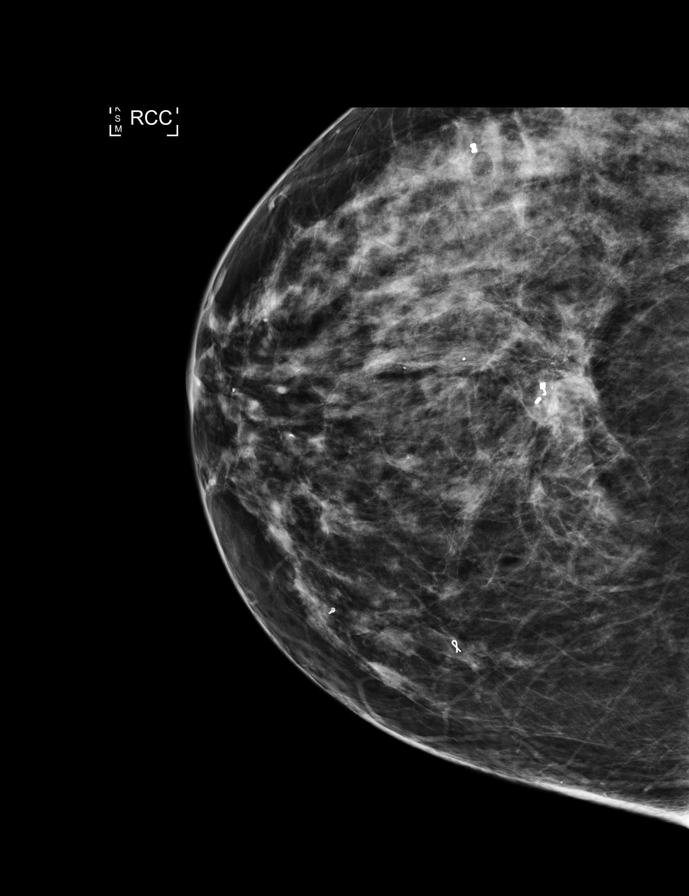

[R CC (4 of 4)]
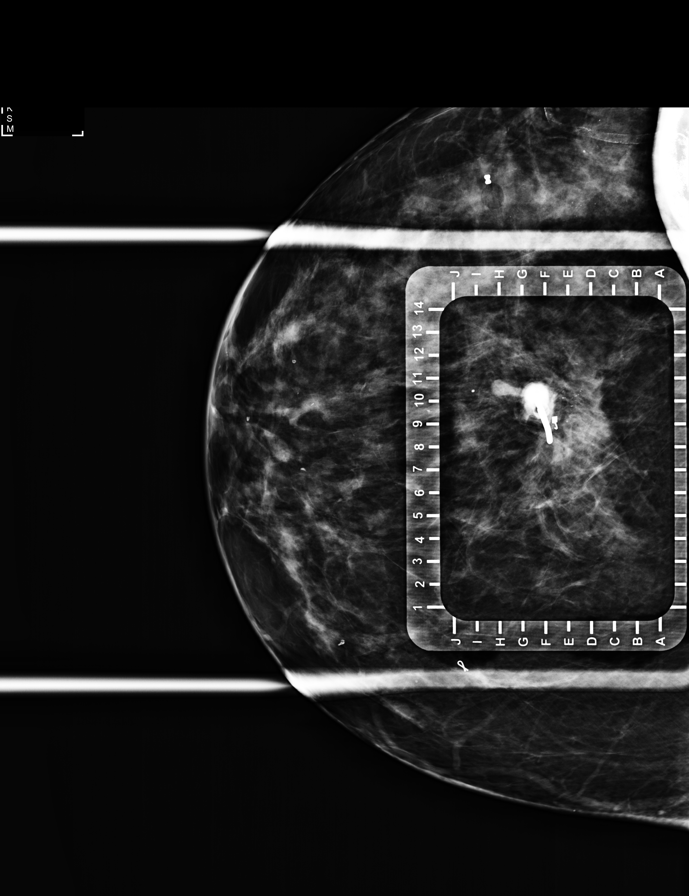

[R ML (4 of 4)]
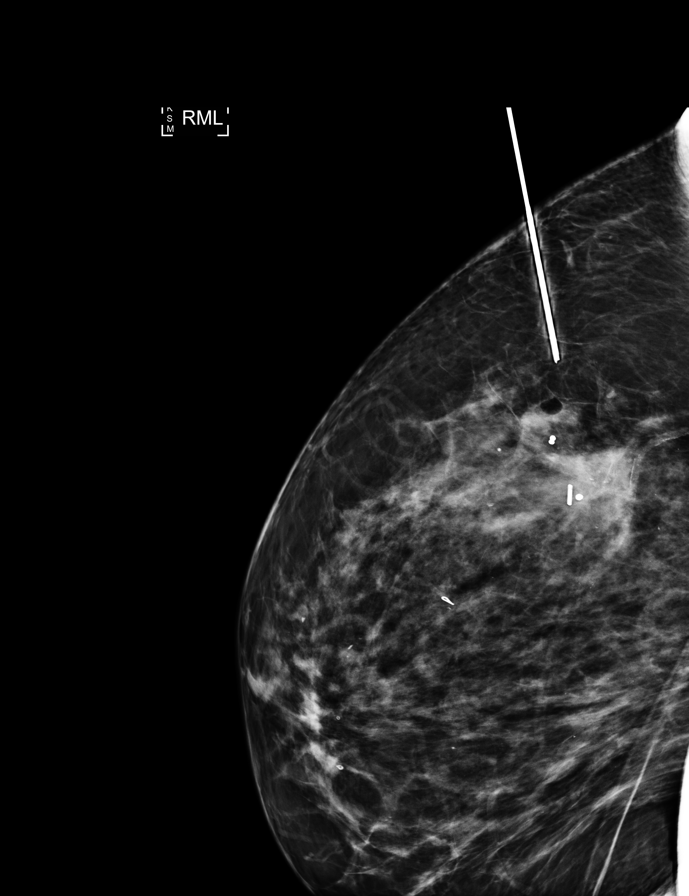

[8 of 8 positions shown; findings below may reference images not displayed]

FINDINGS: Patient presents for radioactive seed localization prior to
lumpectomy. I met with the patient and we discussed the procedure of
seed localization including benefits and alternatives. We discussed
the high likelihood of a successful procedure. We discussed the
risks of the procedure including infection, bleeding, tissue injury
and further surgery. We discussed the low dose of radioactivity
involved in the procedure. Informed, written consent was given.

The usual time-out protocol was performed immediately prior to the
procedure.

Using mammographic guidance, sterile technique, 1% lidocaine and an
E-[id number] radioactive seed,

The coil shaped clip in the UPPER portion of the RIGHT breast was
localized using a craniocaudal approach. The follow-up mammogram
images confirm the seed in the expected location and were marked for
Dr. Jim.

Follow-up survey of the patient confirms presence of the radioactive
seed.

Order number of [id number] seed:  [id number].

Total activity:  0.256 reference Date: 11/15/2020

The patient tolerated the procedure well and was released from the
[REDACTED]. She was given instructions regarding seed removal.
IMPRESSION: Radioactive seed localization right breast. No apparent
complications.

## 2023-08-26 IMAGING — MG MM BREAST SURGICAL SPECIMEN
1 series · 2 of 2 positions shown · non-contrast
Comparison: Previous exam(s).

CLINICAL DATA: Status post right breast lumpectomy for breast
cancer.

EXAM:
SPECIMEN RADIOGRAPH OF THE right BREAST

[Series 1: R · right · 0.07mm/px · 2 of 2 slices shown]
[im 1/2]
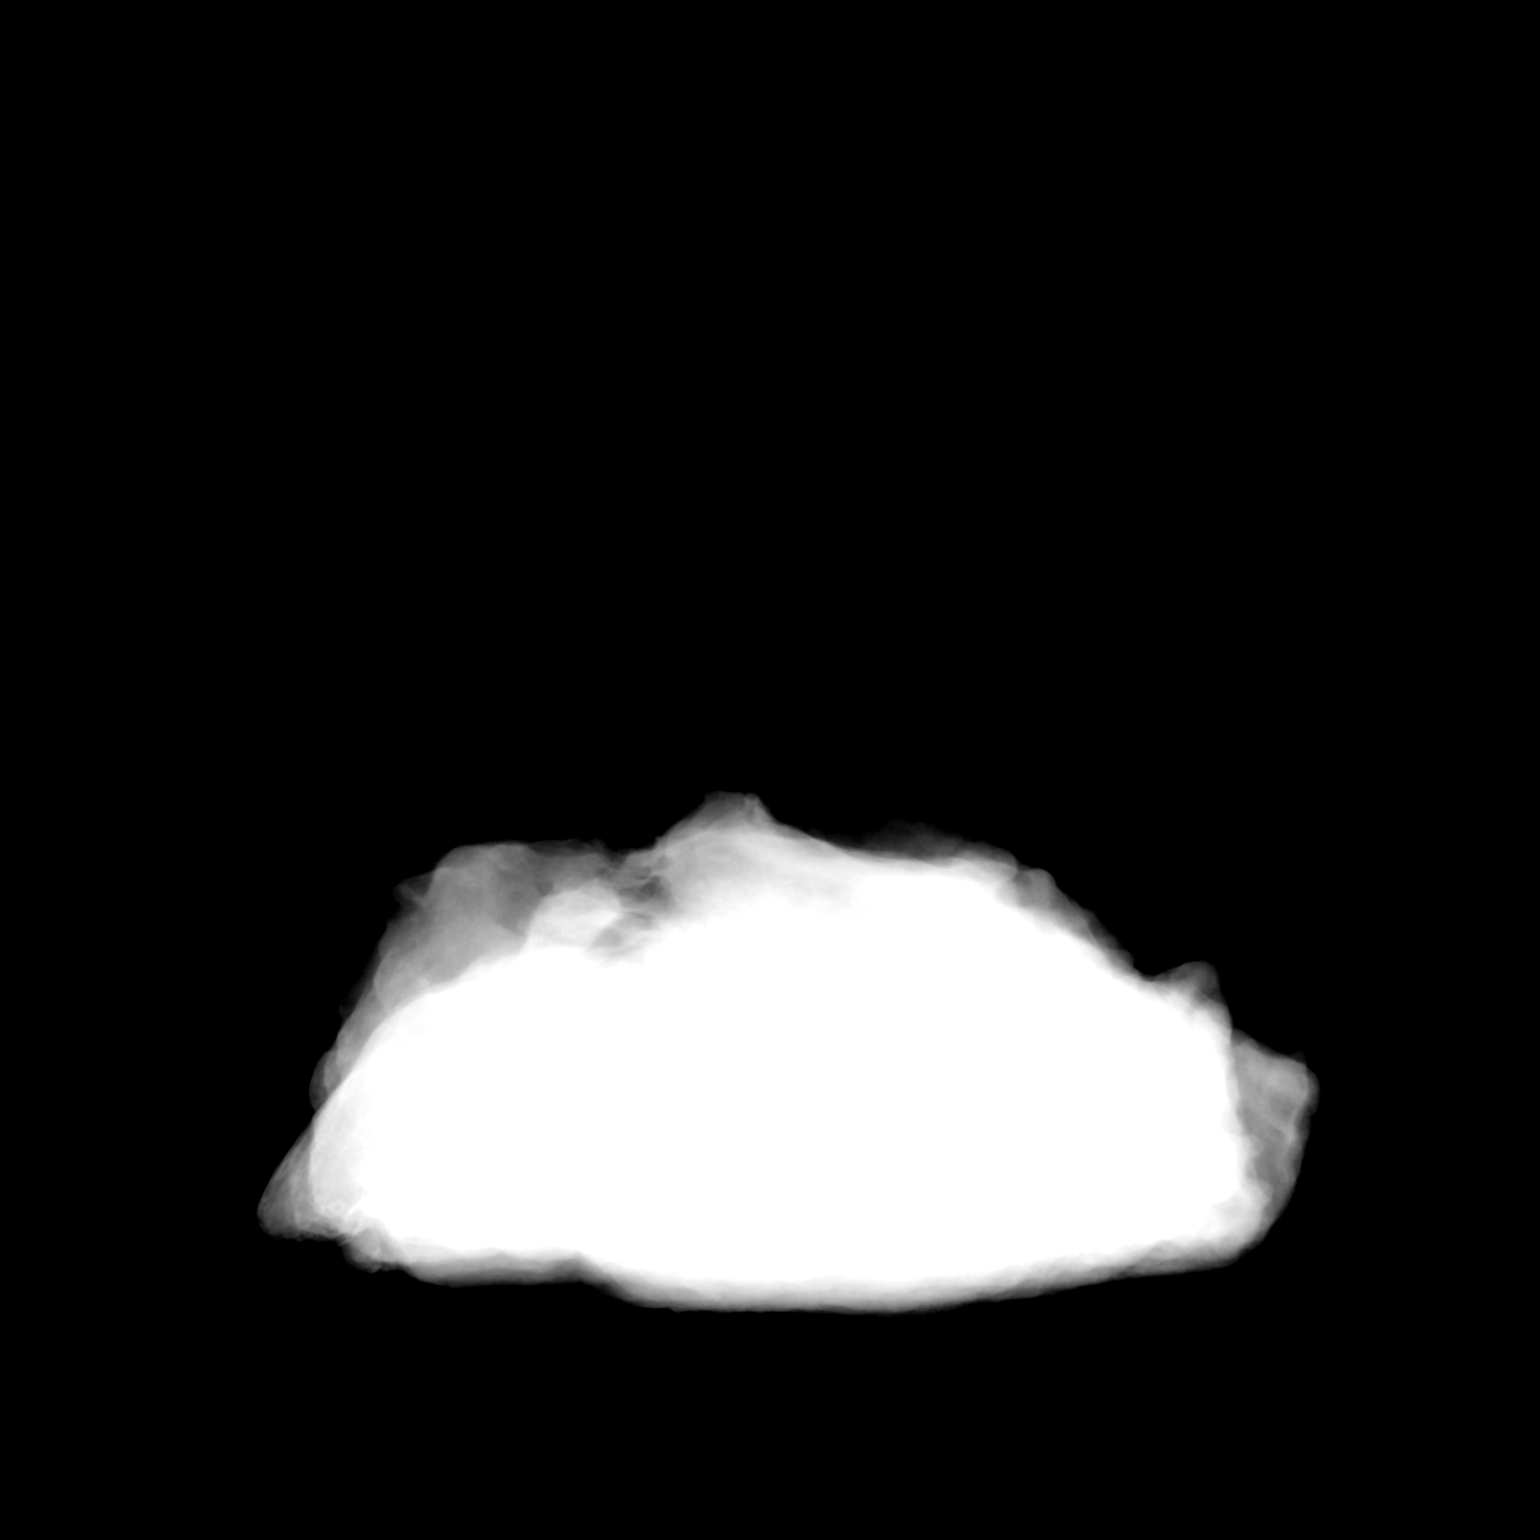
[im 2/2]
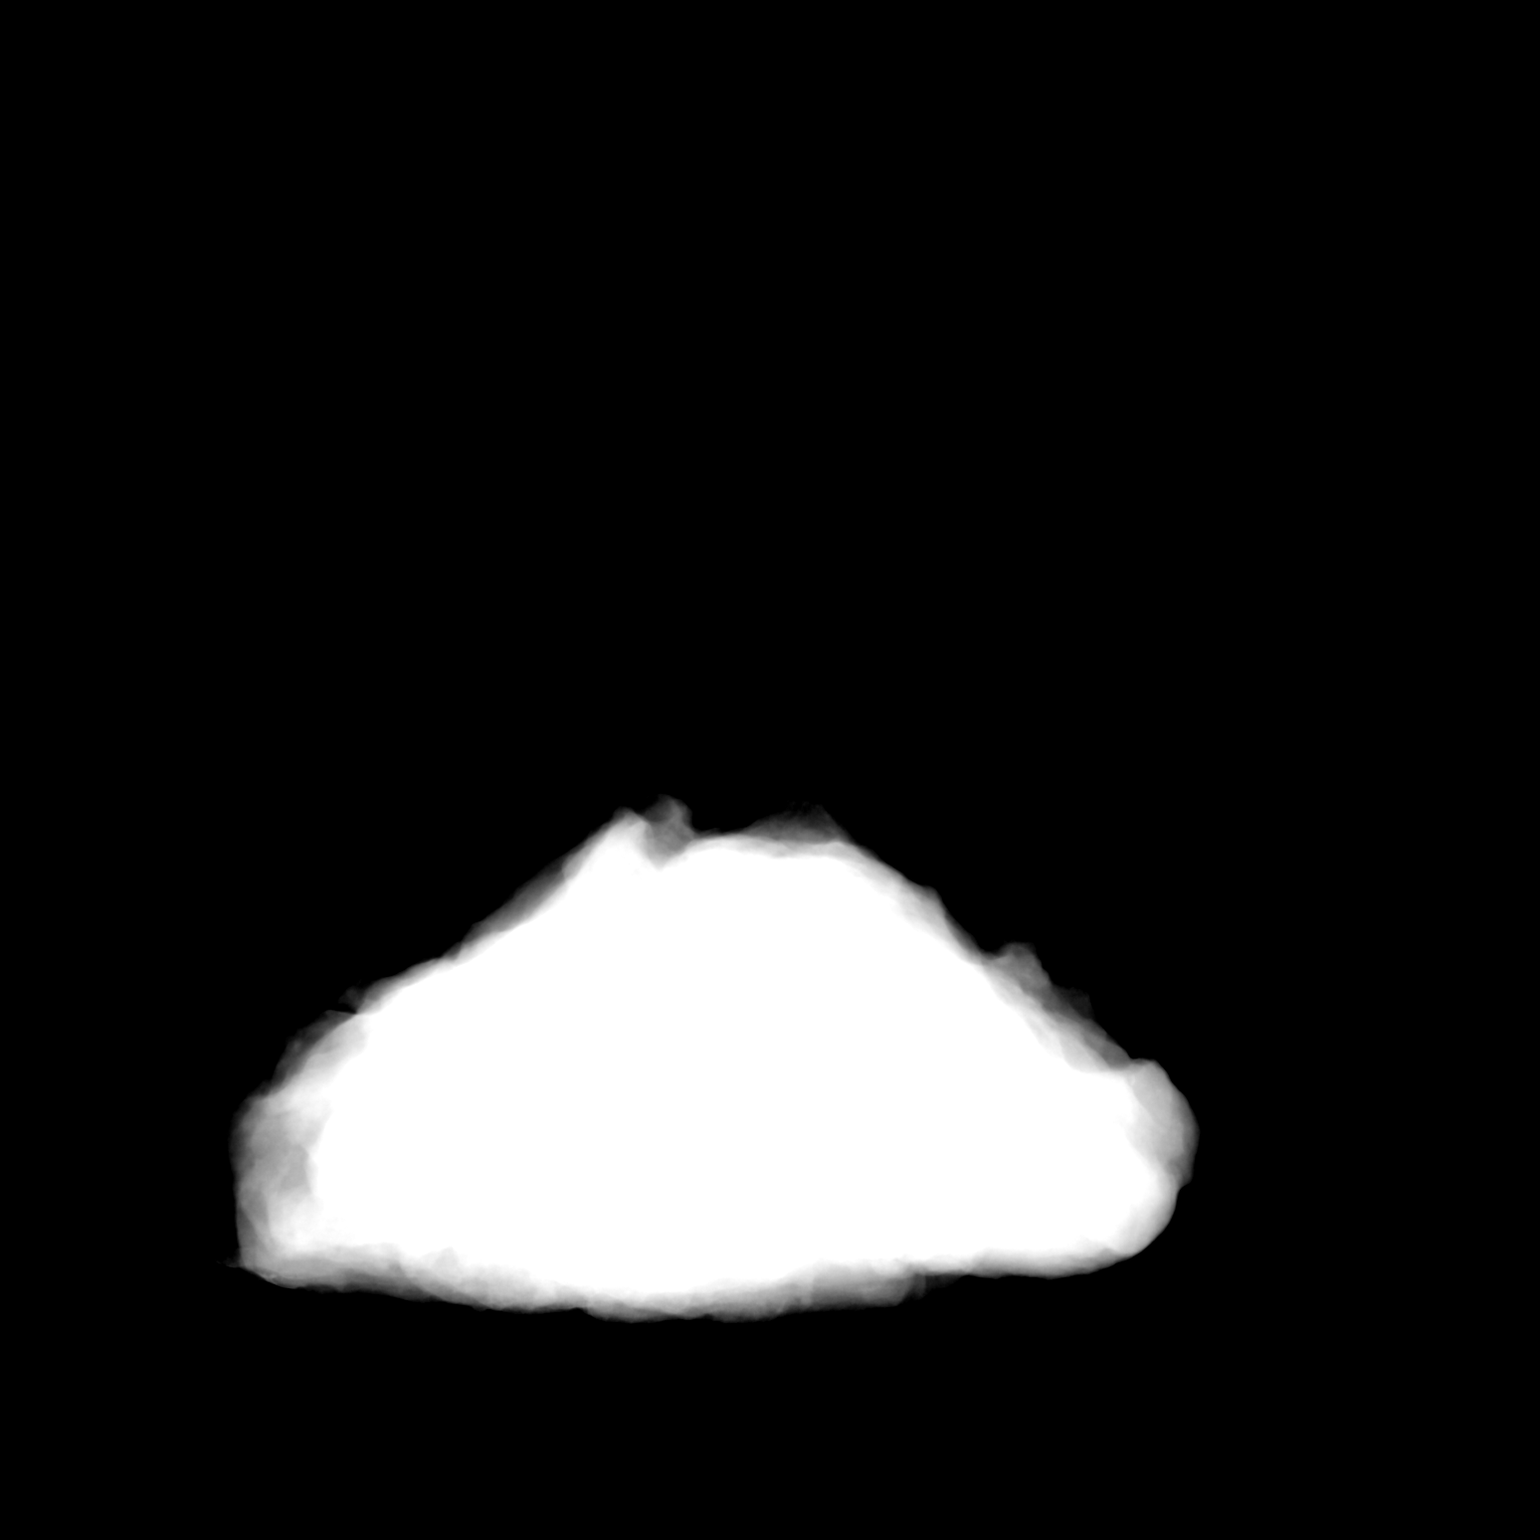

[2 of 2 positions shown; findings below may reference images not displayed]

FINDINGS: Status post excision of the right breast. The radioactive seed and
coil biopsy marker clip are present, completely intact, and were
marked for pathology.
IMPRESSION: Specimen radiograph of the right breast.

## 2023-09-17 DIAGNOSIS — M1711 Unilateral primary osteoarthritis, right knee: Secondary | ICD-10-CM | POA: Diagnosis not present

## 2023-09-17 DIAGNOSIS — M25561 Pain in right knee: Secondary | ICD-10-CM | POA: Diagnosis not present

## 2024-01-19 DIAGNOSIS — M1711 Unilateral primary osteoarthritis, right knee: Secondary | ICD-10-CM | POA: Diagnosis not present

## 2024-01-26 DIAGNOSIS — M79671 Pain in right foot: Secondary | ICD-10-CM | POA: Diagnosis not present

## 2024-01-26 DIAGNOSIS — M79672 Pain in left foot: Secondary | ICD-10-CM | POA: Diagnosis not present

## 2024-02-21 DIAGNOSIS — E785 Hyperlipidemia, unspecified: Secondary | ICD-10-CM | POA: Diagnosis not present

## 2024-02-21 DIAGNOSIS — E7849 Other hyperlipidemia: Secondary | ICD-10-CM | POA: Diagnosis not present

## 2024-02-21 DIAGNOSIS — I10 Essential (primary) hypertension: Secondary | ICD-10-CM | POA: Diagnosis not present

## 2024-02-21 DIAGNOSIS — Z1389 Encounter for screening for other disorder: Secondary | ICD-10-CM | POA: Diagnosis not present

## 2024-02-21 DIAGNOSIS — R7303 Prediabetes: Secondary | ICD-10-CM | POA: Diagnosis not present

## 2024-02-22 ENCOUNTER — Ambulatory Visit
Admission: RE | Admit: 2024-02-22 | Discharge: 2024-02-22 | Disposition: A | Discharge status: Home / Self Care | Source: Ambulatory Visit | Attending: Hematology and Oncology | Admitting: Hematology and Oncology

## 2024-02-22 DIAGNOSIS — R928 Other abnormal and inconclusive findings on diagnostic imaging of breast: Secondary | ICD-10-CM | POA: Diagnosis not present

## 2024-02-22 DIAGNOSIS — C50411 Malignant neoplasm of upper-outer quadrant of right female breast: Secondary | ICD-10-CM

## 2024-02-22 DIAGNOSIS — R92333 Mammographic heterogeneous density, bilateral breasts: Secondary | ICD-10-CM | POA: Diagnosis not present

## 2024-02-23 DIAGNOSIS — I10 Essential (primary) hypertension: Secondary | ICD-10-CM | POA: Diagnosis not present

## 2024-02-23 DIAGNOSIS — R82998 Other abnormal findings in urine: Secondary | ICD-10-CM | POA: Diagnosis not present

## 2024-02-23 DIAGNOSIS — E785 Hyperlipidemia, unspecified: Secondary | ICD-10-CM | POA: Diagnosis not present

## 2024-02-23 DIAGNOSIS — K5904 Chronic idiopathic constipation: Secondary | ICD-10-CM | POA: Diagnosis not present

## 2024-02-23 DIAGNOSIS — F419 Anxiety disorder, unspecified: Secondary | ICD-10-CM | POA: Diagnosis not present

## 2024-02-23 DIAGNOSIS — M1990 Unspecified osteoarthritis, unspecified site: Secondary | ICD-10-CM | POA: Diagnosis not present

## 2024-02-23 DIAGNOSIS — Z1339 Encounter for screening examination for other mental health and behavioral disorders: Secondary | ICD-10-CM | POA: Diagnosis not present

## 2024-02-23 DIAGNOSIS — Z0000 Encounter for general adult medical examination without abnormal findings: Secondary | ICD-10-CM | POA: Diagnosis not present

## 2024-02-23 DIAGNOSIS — E1121 Type 2 diabetes mellitus with diabetic nephropathy: Secondary | ICD-10-CM | POA: Diagnosis not present

## 2024-02-23 DIAGNOSIS — G629 Polyneuropathy, unspecified: Secondary | ICD-10-CM | POA: Diagnosis not present

## 2024-02-23 DIAGNOSIS — Z170 Estrogen receptor positive status [ER+]: Secondary | ICD-10-CM | POA: Diagnosis not present

## 2024-02-23 DIAGNOSIS — G4700 Insomnia, unspecified: Secondary | ICD-10-CM | POA: Diagnosis not present

## 2024-02-23 DIAGNOSIS — E669 Obesity, unspecified: Secondary | ICD-10-CM | POA: Diagnosis not present

## 2024-02-23 DIAGNOSIS — C50411 Malignant neoplasm of upper-outer quadrant of right female breast: Secondary | ICD-10-CM | POA: Diagnosis not present

## 2024-02-23 DIAGNOSIS — Z1331 Encounter for screening for depression: Secondary | ICD-10-CM | POA: Diagnosis not present

## 2024-03-13 DIAGNOSIS — G629 Polyneuropathy, unspecified: Secondary | ICD-10-CM | POA: Diagnosis not present

## 2024-03-13 DIAGNOSIS — R7303 Prediabetes: Secondary | ICD-10-CM | POA: Diagnosis not present

## 2024-03-13 DIAGNOSIS — R269 Unspecified abnormalities of gait and mobility: Secondary | ICD-10-CM | POA: Diagnosis not present

## 2024-03-13 DIAGNOSIS — E785 Hyperlipidemia, unspecified: Secondary | ICD-10-CM | POA: Diagnosis not present

## 2024-03-13 DIAGNOSIS — R42 Dizziness and giddiness: Secondary | ICD-10-CM | POA: Diagnosis not present

## 2024-03-13 DIAGNOSIS — E1121 Type 2 diabetes mellitus with diabetic nephropathy: Secondary | ICD-10-CM | POA: Diagnosis not present

## 2024-03-13 DIAGNOSIS — I10 Essential (primary) hypertension: Secondary | ICD-10-CM | POA: Diagnosis not present

## 2024-03-17 DIAGNOSIS — M1711 Unilateral primary osteoarthritis, right knee: Secondary | ICD-10-CM | POA: Diagnosis not present

## 2024-03-27 ENCOUNTER — Other Ambulatory Visit: Payer: Medicare Other

## 2024-03-28 DIAGNOSIS — Z23 Encounter for immunization: Secondary | ICD-10-CM | POA: Diagnosis not present

## 2024-04-04 DIAGNOSIS — M1711 Unilateral primary osteoarthritis, right knee: Secondary | ICD-10-CM | POA: Diagnosis not present

## 2024-04-11 DIAGNOSIS — Z23 Encounter for immunization: Secondary | ICD-10-CM | POA: Diagnosis not present

## 2024-04-12 ENCOUNTER — Telehealth: Payer: Self-pay

## 2024-04-12 NOTE — Telephone Encounter (Signed)
 Left message on voicemail about upcoming appointment on 11/6

## 2024-04-13 ENCOUNTER — Inpatient Hospital Stay: Payer: Medicare Other | Attending: Hematology and Oncology | Admitting: Hematology and Oncology

## 2024-04-13 VITALS — BP 145/59 | HR 101 | Temp 98.1°F | Resp 18 | Wt 216.8 lb

## 2024-04-13 DIAGNOSIS — Z79811 Long term (current) use of aromatase inhibitors: Secondary | ICD-10-CM | POA: Diagnosis not present

## 2024-04-13 DIAGNOSIS — Z780 Asymptomatic menopausal state: Secondary | ICD-10-CM | POA: Diagnosis not present

## 2024-04-13 DIAGNOSIS — C50411 Malignant neoplasm of upper-outer quadrant of right female breast: Secondary | ICD-10-CM | POA: Insufficient documentation

## 2024-04-13 DIAGNOSIS — Z923 Personal history of irradiation: Secondary | ICD-10-CM | POA: Insufficient documentation

## 2024-04-13 DIAGNOSIS — Z13820 Encounter for screening for osteoporosis: Secondary | ICD-10-CM | POA: Diagnosis not present

## 2024-04-13 DIAGNOSIS — Z170 Estrogen receptor positive status [ER+]: Secondary | ICD-10-CM | POA: Diagnosis not present

## 2024-04-13 DIAGNOSIS — Z1732 Human epidermal growth factor receptor 2 negative status: Secondary | ICD-10-CM | POA: Diagnosis not present

## 2024-04-13 DIAGNOSIS — Z1721 Progesterone receptor positive status: Secondary | ICD-10-CM | POA: Insufficient documentation

## 2024-04-13 DIAGNOSIS — Z800 Family history of malignant neoplasm of digestive organs: Secondary | ICD-10-CM | POA: Insufficient documentation

## 2024-04-13 NOTE — Progress Notes (Signed)
 [facility]  [phone] [phone]    ID: Chelsea Lee DOB: [birth year]  MR#: [id number]  RDW#:[id number]  Patient Care Team: Larnell Hamilton, MD as PCP - General (Internal Medicine) Shayne Anes, MD as Consulting Physician (Internal Medicine) Vernetta Berg, MD as Consulting Physician (General Surgery) Shannon Agent, MD as Consulting Physician (Radiation Oncology) Lynnell Nottingham, MD as Consulting Physician (Dermatology) Sheril Coy, MD as Consulting Physician (Orthopedic Surgery) Martyn Redell BIRCH, MD as Referring Physician (Otolaryngology) Loretha Ash, MD as Consulting Physician (Hematology and Oncology) Crawford, Morna Pickle, NP as Nurse Practitioner (Hematology and Oncology) Starla Wendelyn BIRCH, RN as Registered Nurse Ash Loretha, MD  CHIEF COMPLAINT: Estrogen receptor positive breast cancer  CURRENT TREATMENT: Anastrozole   INTERVAL HISTORY:   History of Present Illness    Discussed the use of AI scribe software for clinical note transcription with the patient, who gave verbal consent to proceed.  History of Present Illness Chelsea Lee is an 78 year old female with a history of breast cancer who presents for a routine follow-up visit.  She is currently on anastrozole  therapy for breast cancer and has not experienced any adverse effects. Her last mammogram was in September, and she has noted no changes in her breast self-exams.  She has a family history of dementia, as her mother had it in her nineties. She initially took Prevagen for prevention but discontinued it after learning it was ineffective.  She experiences knee pain due to arthritis, describing it as 'bone on bone.' A recent steroid injection, which usually provides relief, has not been effective this time. She is concerned about her upcoming trip to [facility], where she will need to walk extensively.  She has a history of foot surgery and continues to  experience foot pain.  She has not yet completed a bone density scan, as she did not receive the necessary communication to schedule it.  She resides in [place] and travels to [place] for medical appointments. She plans to spend a month in [place], where her children and friends will visit.  She also takes vitamin D3 but has not been consistent with it.  REVIEW OF SYSTEMS:  A detailed review of systems today was otherwise stable.   COVID 19 VACCINATION STATUS: Pfizer x5 as of November 2022   HISTORY OF CURRENT ILLNESS: From the original intake note:  Chelsea Lee had routine screening mammography on 11/11/2020 showing a possible abnormality in the bilateral breasts. She underwent bilateral diagnostic mammography with tomography and right breast ultrasonography at [facility] on 01/08/2021 showing: breast density category C; 2.6 cm posterior upper-outer right breast mass; no abnormal right axillary lymph nodes; more likely benign complex cystic changes within inner right breast; benign left breast calcifications.  Accordingly on 01/13/2021 she proceeded to biopsy of the right breast area in question. The pathology from the 11 o'clock mass ([id number]) showed: invasive ductal carcinoma, grade 2-3; ductal carcinoma in situ with necrosis. Prognostic indicators significant for: estrogen receptor, >95% positive and progesterone receptor, >95% positive, both with strong staining intensity. Proliferation marker Ki67 at 15%. HER2 equivocal by immunohistochemistry (2+), but negative by fluorescent in situ hybridization with a signals ratio 1.3 and number per cell 1.75.  The area in the right breast at 2:30 was biopsied at the same time showing fibrocystic change with apocrine metaplasia.   Cancer Staging  Malignant neoplasm of upper-outer quadrant of right breast in female, estrogen receptor positive (HCC) Staging form: Breast, AJCC 8th  Edition - Clinical stage from 01/22/2021:  Stage IB (cT2, cN0, cM0, G2, ER+, PR+, HER2-) - Signed by Layla Sandria BROCKS, MD on 01/22/2021 Stage prefix: Initial diagnosis Histologic grading system: 3 grade system   The patient's subsequent history is as detailed below.   PAST MEDICAL HISTORY: Past Medical History:  Diagnosis Date   Anemia    Anxiety    Breast cancer (HCC)    02/17/21 right breast   Cataract    Cervical radiculopathy    CKD (chronic kidney disease)    stage 3   Hearing loss    History of radiation therapy    Right breast- 03/27/21-04/25/21- Dr. Lynwood Nasuti   HLD (hyperlipidemia)    Obesity    Osteoarthritis    Personal history of radiation therapy   She reports a history of skin cancer, treated with 19 fractions of radiation therapy while living in [place]  (Dr. Darlena)   PAST SURGICAL HISTORY: Past Surgical History:  Procedure Laterality Date   BREAST BIOPSY Left    BREAST LUMPECTOMY Right    02/17/21   BREAST LUMPECTOMY WITH RADIOACTIVE SEED AND SENTINEL LYMPH NODE BIOPSY Right 02/17/2021   Procedure: RIGHT BREAST LUMPECTOMY WITH RADIOACTIVE SEED AND SENTINEL LYMPH NODE BIOPSY;  Surgeon: Vernetta Berg, MD;  Location: [facility];  Service: General;  Laterality: Right;   COLONOSCOPY  2016   FOOT SURGERY Right    MENISCUS REPAIR Right 06/08/2006    FAMILY HISTORY: Family History  Problem Relation Age of Onset   Diabetes Mother    Dementia Mother    Heart disease Father        age 76 death   Alcohol  abuse Father    Esophageal cancer Brother    Colon cancer Neg Hx    Stomach cancer Neg Hx    Her father died at age 39 from MI. Her mother died at age 9. Chelsea Lee has three brothers (and no sisters). There is no family history of cancer to her knowledge.   GYNECOLOGIC HISTORY:  No LMP recorded. Patient is postmenopausal. Menarche: 78 years old Age at first live birth: 78 years old GX P 2 LMP date unsure Contraceptive: used from age 65 to ?  (Several years) HRT never  used  Hysterectomy? no BSO? 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 Husband Lamar Kava is a product/process development scientist. She lives at home with Kava; they have no pets. Son [place], age 59, is self employed and lives in [place], [place]. Daughter Rollo Pagan, age 22, is a advertising copywriter for [employer] in [place], [place]. Shardee attends [employer].    ADVANCED DIRECTIVES: In the absence of any documentation to the contrary, the patient's spouse is their HCPOA.    HEALTH MAINTENANCE: Social History   Tobacco Use   Smoking status: Never   Smokeless tobacco: Never  Substance Use Topics   Alcohol  use: Yes    Alcohol /week: 2.0 standard drinks of alcohol     Types: 2 Glasses of wine per week    Comment: social   Drug use: No     Colonoscopy: yes, done in [place]   PAP: date unsure  Bone density: Nov 2022  No Known Allergies  Current Outpatient Medications  Medication Sig Dispense Refill   ALPRAZolam (XANAX) 1 MG tablet Take 1 tablet (1 mg total) by mouth 3 (three) times daily as needed for anxiety. 30 tablet 0   anastrozole  (ARIMIDEX ) 1 MG  tablet Take 1 tablet (1 mg total) by mouth daily. 90 tablet 4   Apoaequorin (PREVAGEN PO) Take 1 tablet by mouth daily.     celecoxib (CELEBREX) 200 MG capsule Take 200 mg by mouth 2 (two) times daily.     cholecalciferol (VITAMIN D3) 25 MCG (1000 UNIT) tablet Take 1 tablet (1,000 Units total) by mouth daily.     gabapentin  (NEURONTIN ) 300 MG capsule Take 1 capsule (300 mg total) by mouth 2 (two) times daily. 270 capsule 3   simvastatin (ZOCOR) 40 MG tablet Take 1 tablet (40 mg total) by mouth at bedtime. 30 tablet    No current facility-administered medications for this visit.    OBJECTIVE: White woman who appears stated age  Vitals:   04/13/24 1339  BP: (!) 145/59  Pulse: (!) 101  Resp: 18  Temp: 98.1 F (36.7 C)  SpO2: 97%       Body mass index is 32.96 kg/m.    Wt Readings from Last 3 Encounters:  04/13/24 216 lb 12.8 oz (98.3 kg)  04/13/23 210 lb 8 oz (95.5 kg)  10/09/22 213 lb 11.2 oz (96.9 kg)      ECOG FS:1 - Symptomatic but completely ambulatory  Physical Exam Constitutional:      Appearance: Normal appearance.  Cardiovascular:     Rate and Rhythm: Normal rate and regular rhythm.     Pulses: Normal pulses.     Heart sounds: Normal heart sounds.  Pulmonary:     Effort: Pulmonary effort is normal.     Breath sounds: Normal breath sounds.  Chest:     Comments: Bilateral breasts inspected. No other definitive palpable masses.  No regional adenopathy.   Abdominal:     General: Abdomen is flat. Bowel sounds are normal.     Palpations: Abdomen is soft.  Musculoskeletal:        General: No swelling or tenderness.     Cervical back: Normal range of motion and neck supple. No rigidity.  Lymphadenopathy:     Cervical: No cervical adenopathy.  Skin:    General: Skin is warm and dry.  Neurological:     General: No focal deficit present.     Mental Status: She is alert.      LAB RESULTS:  CMP     Component Value Date/Time   NA 140 06/10/2021 1530   K 4.0 06/10/2021 1530   CL 107 06/10/2021 1530   CO2 27 06/10/2021 1530   GLUCOSE 141 (H) 06/10/2021 1530   BUN 21 06/10/2021 1530   CREATININE 1.18 (H) 06/10/2021 1530   CREATININE 1.25 (H) 01/22/2021 0825   CALCIUM 9.4 06/10/2021 1530   PROT 6.7 06/10/2021 1530   ALBUMIN 3.9 06/10/2021 1530   AST 24 06/10/2021 1530   AST 25 01/22/2021 0825   ALT 23 06/10/2021 1530   ALT 29 01/22/2021 0825   ALKPHOS 92 06/10/2021 1530   BILITOT 0.4 06/10/2021 1530   BILITOT 0.5 01/22/2021 0825   GFRNONAA 48 (L) 06/10/2021 1530   GFRNONAA 44 (L) 01/22/2021 0825    No results found for: TOTALPROTELP, ALBUMINELP, A1GS, A2GS, BETS, BETA2SER, GAMS, MSPIKE, SPEI  Lab Results  Component Value Date   WBC 4.4 07/03/2021   NEUTROABS 2.3 06/10/2021   HGB 12.6 07/03/2021    HCT 38.3 07/03/2021   MCV 93.6 07/03/2021   PLT 174.0 07/03/2021    No results found for: LABCA2  No components found for: [id number]  No results for input(s): INR in the  last 168 hours.  No results found for: LABCA2  No results found for: RJW800  No results found for: CAN125  No results found for: CAN153  No results found for: CA2729  No components found for: HGQUANT  No results found for: CEA1, CEA / No results found for: CEA1, CEA   No results found for: AFPTUMOR  No results found for: CHROMOGRNA  No results found for: KPAFRELGTCHN, LAMBDASER, KAPLAMBRATIO (kappa/lambda light chains)  No results found for: HGBA, HGBA2QUANT, HGBFQUANT, HGBSQUAN (Hemoglobinopathy evaluation)   No results found for: LDH  No results found for: IRON, TIBC, IRONPCTSAT (Iron and TIBC)  No results found for: FERRITIN  Urinalysis No results found for: COLORURINE, APPEARANCEUR, LABSPEC, PHURINE, GLUCOSEU, HGBUR, BILIRUBINUR, KETONESUR, PROTEINUR, UROBILINOGEN, NITRITE, LEUKOCYTESUR   STUDIES: No results found.   ELIGIBLE FOR AVAILABLE RESEARCH PROTOCOL: no  ASSESSMENT:   78 y.o. [place] woman status post right breast upper outer quadrant biopsy 01/13/2021 for a clinical T2 N0, stage IB-IIA invasive ductal carcinoma, grade 2/3, estrogen and progesterone receptor positive, HER2 not amplified, with an MIB-1 of 15%   (A) biopsy of a separate area in the right breast upper outer quadrant 02/06/2021 showed no evidence of malignancy.  (1) right lumpectomy and sentinel lymph node sampling on 02/17/2021 showed a pT2 pN0, stage IB invasive ductal carcinoma, grade 2, with negative margins  (A) a total of 3 right axillary sentinel lymph nodes were removed.  (2) Oncotype score of 12 predicted a risk of recurrence outside the breast within the next 9 years of 3% if the patient's only systemic therapy is  antiestrogens for 5 years.  It also predicted no significant benefit from chemotherapy.  (3) adjuvant radiation 03/27/2021 through 04/24/2021  (4) antiestrogens to follow the completion of local treatment  (A) bone density 05/19/2005-- repeat 04/24/2021  (B) used oral contraceptives several years with no complications  (5) anastrozole  started 05/08/2021  Bone density is normal 04/24/2021, ok to continue Ca/Vit D supplements,   PLAN:  Assessment and Plan Assessment & Plan Right breast cancer, status post surgery, on adjuvant anastrozole  therapy Currently on adjuvant anastrozole  therapy. No new breast changes. Normal mammogram in September. No significant hot flashes. - Continue anastrozole  therapy. - Schedule next mammogram for September next year. - Report any new breast changes immediately.  Osteoporosis screening Pending. Resides in [place], uncertain of local facilities. [facility] to be [facility]. She will try to coordinate appointment with other appts so she can avoid multiple trips.  I spent 30 minutes in the care of the patient including history, review of records, physical examination, counseling about diarrhea, role of antiestrogen therapy and follow-up recommendations  *Total Encounter Time as defined by the Centers for Medicare and Medicaid Services includes, in addition to the face-to-face time of a patient visit (documented in the note above) non-face-to-face time: obtaining and reviewing outside history, ordering and reviewing medications, tests or procedures, care coordination (communications with other health care professionals or caregivers) and documentation in the medical record.

## 2024-04-14 DIAGNOSIS — M1711 Unilateral primary osteoarthritis, right knee: Secondary | ICD-10-CM | POA: Diagnosis not present

## 2024-04-24 DIAGNOSIS — H02834 Dermatochalasis of left upper eyelid: Secondary | ICD-10-CM | POA: Diagnosis not present

## 2024-04-24 DIAGNOSIS — H04123 Dry eye syndrome of bilateral lacrimal glands: Secondary | ICD-10-CM | POA: Diagnosis not present

## 2024-04-24 DIAGNOSIS — H02831 Dermatochalasis of right upper eyelid: Secondary | ICD-10-CM | POA: Diagnosis not present

## 2025-02-21 ENCOUNTER — Other Ambulatory Visit (HOSPITAL_BASED_OUTPATIENT_CLINIC_OR_DEPARTMENT_OTHER)

## 2025-04-17 ENCOUNTER — Inpatient Hospital Stay: Admitting: Hematology and Oncology
